# Patient Record
Sex: Male | Born: 1955 | Race: White | Hispanic: No | Smoking: Former smoker
Health system: Southern US, Community
[De-identification: ages and names within clinical notes are randomized; demographics above are authoritative.]

## PROBLEM LIST (undated history)

## (undated) DIAGNOSIS — W11XXXA Fall on and from ladder, initial encounter: Secondary | ICD-10-CM

## (undated) HISTORY — PX: NO PAST SURGERIES: SHX2092

---

## 2010-02-20 ENCOUNTER — Emergency Department (HOSPITAL_COMMUNITY): Admission: EM | Admit: 2010-02-20 | Discharge: 2010-02-20 | Payer: Self-pay | Admitting: Emergency Medicine

## 2010-03-24 ENCOUNTER — Encounter
Admission: RE | Admit: 2010-03-24 | Discharge: 2010-05-24 | Payer: Self-pay | Source: Home / Self Care | Attending: Orthopedic Surgery | Admitting: Orthopedic Surgery

## 2014-10-26 DIAGNOSIS — W11XXXA Fall on and from ladder, initial encounter: Secondary | ICD-10-CM

## 2014-10-26 HISTORY — DX: Fall on and from ladder, initial encounter: W11.XXXA

## 2014-10-27 ENCOUNTER — Inpatient Hospital Stay (HOSPITAL_COMMUNITY)
Admission: EM | Admit: 2014-10-27 | Discharge: 2014-11-05 | DRG: 493 | Disposition: A | Discharge status: Home / Self Care | Payer: Self-pay | Attending: Surgery | Admitting: Surgery

## 2014-10-27 ENCOUNTER — Encounter (HOSPITAL_COMMUNITY): Payer: Self-pay | Admitting: Emergency Medicine

## 2014-10-27 ENCOUNTER — Emergency Department (HOSPITAL_COMMUNITY): Payer: Self-pay

## 2014-10-27 ENCOUNTER — Inpatient Hospital Stay (HOSPITAL_COMMUNITY): Payer: Self-pay

## 2014-10-27 ENCOUNTER — Inpatient Hospital Stay (HOSPITAL_COMMUNITY): Payer: MEDICAID

## 2014-10-27 DIAGNOSIS — S63075A Dislocation of distal end of left ulna, initial encounter: Secondary | ICD-10-CM | POA: Diagnosis present

## 2014-10-27 DIAGNOSIS — Z87891 Personal history of nicotine dependence: Secondary | ICD-10-CM

## 2014-10-27 DIAGNOSIS — S52612A Displaced fracture of left ulna styloid process, initial encounter for closed fracture: Secondary | ICD-10-CM | POA: Diagnosis present

## 2014-10-27 DIAGNOSIS — W19XXXA Unspecified fall, initial encounter: Secondary | ICD-10-CM

## 2014-10-27 DIAGNOSIS — Z419 Encounter for procedure for purposes other than remedying health state, unspecified: Secondary | ICD-10-CM

## 2014-10-27 DIAGNOSIS — IMO0001 Reserved for inherently not codable concepts without codable children: Secondary | ICD-10-CM | POA: Diagnosis present

## 2014-10-27 DIAGNOSIS — W11XXXA Fall on and from ladder, initial encounter: Secondary | ICD-10-CM

## 2014-10-27 DIAGNOSIS — E559 Vitamin D deficiency, unspecified: Secondary | ICD-10-CM | POA: Diagnosis present

## 2014-10-27 DIAGNOSIS — R338 Other retention of urine: Secondary | ICD-10-CM | POA: Diagnosis not present

## 2014-10-27 DIAGNOSIS — S52592A Other fractures of lower end of left radius, initial encounter for closed fracture: Secondary | ICD-10-CM | POA: Diagnosis present

## 2014-10-27 DIAGNOSIS — S82143A Displaced bicondylar fracture of unspecified tibia, initial encounter for closed fracture: Secondary | ICD-10-CM

## 2014-10-27 DIAGNOSIS — R339 Retention of urine, unspecified: Secondary | ICD-10-CM | POA: Diagnosis not present

## 2014-10-27 DIAGNOSIS — S32010A Wedge compression fracture of first lumbar vertebra, initial encounter for closed fracture: Secondary | ICD-10-CM | POA: Diagnosis present

## 2014-10-27 DIAGNOSIS — D62 Acute posthemorrhagic anemia: Secondary | ICD-10-CM | POA: Diagnosis not present

## 2014-10-27 DIAGNOSIS — S82831A Other fracture of upper and lower end of right fibula, initial encounter for closed fracture: Secondary | ICD-10-CM | POA: Diagnosis present

## 2014-10-27 DIAGNOSIS — S5292XA Unspecified fracture of left forearm, initial encounter for closed fracture: Secondary | ICD-10-CM | POA: Diagnosis present

## 2014-10-27 DIAGNOSIS — S82141A Displaced bicondylar fracture of right tibia, initial encounter for closed fracture: Principal | ICD-10-CM | POA: Diagnosis present

## 2014-10-27 DIAGNOSIS — T79A0XA Compartment syndrome, unspecified, initial encounter: Secondary | ICD-10-CM | POA: Diagnosis present

## 2014-10-27 HISTORY — DX: Fall on and from ladder, initial encounter: W11.XXXA

## 2014-10-27 LAB — BASIC METABOLIC PANEL
ANION GAP: 9 (ref 5–15)
BUN: 11 mg/dL (ref 6–20)
CHLORIDE: 106 mmol/L (ref 101–111)
CO2: 25 mmol/L (ref 22–32)
Calcium: 9.4 mg/dL (ref 8.9–10.3)
Creatinine, Ser: 1.34 mg/dL — ABNORMAL HIGH (ref 0.61–1.24)
GFR calc Af Amer: >60 mL/min (ref >60)
GFR calc non Af Amer: 57 mL/min — ABNORMAL LOW (ref >60)
Glucose, Bld: 109 mg/dL — ABNORMAL HIGH (ref 65–99)
POTASSIUM: 4.1 mmol/L (ref 3.5–5.1)
SODIUM: 140 mmol/L (ref 135–145)

## 2014-10-27 LAB — CBC WITH DIFFERENTIAL/PLATELET
BASOS ABS: 0 10*3/uL (ref 0.0–0.1)
BASOS PCT: 1 % (ref 0–1)
Eosinophils Absolute: 0.2 10*3/uL (ref 0.0–0.7)
Eosinophils Relative: 2 % (ref 0–5)
HCT: 43.2 % (ref 39.0–52.0)
HEMOGLOBIN: 14.7 g/dL (ref 13.0–17.0)
LYMPHS PCT: 32 % (ref 12–46)
Lymphs Abs: 2.3 10*3/uL (ref 0.7–4.0)
MCH: 30.6 pg (ref 26.0–34.0)
MCHC: 34 g/dL (ref 30.0–36.0)
MCV: 89.8 fL (ref 78.0–100.0)
MONO ABS: 0.7 10*3/uL (ref 0.1–1.0)
MONOS PCT: 10 % (ref 3–12)
NEUTROS ABS: 3.8 10*3/uL (ref 1.7–7.7)
Neutrophils Relative %: 55 % (ref 43–77)
PLATELETS: 261 10*3/uL (ref 150–400)
RBC: 4.81 MIL/uL (ref 4.22–5.81)
RDW: 12.7 % (ref 11.5–15.5)
WBC: 7 10*3/uL (ref 4.0–10.5)

## 2014-10-27 MED ORDER — HYDROMORPHONE HCL 1 MG/ML IJ SOLN
1.0000 mg | INTRAMUSCULAR | Status: DC | PRN
  Administered 2014-10-27 (×2): 1 mg via INTRAVENOUS
  Filled 2014-10-27 (×2): qty 1

## 2014-10-27 MED ORDER — OXYCODONE HCL 5 MG PO TABS
10.0000 mg | ORAL_TABLET | ORAL | Status: DC | PRN
  Administered 2014-10-27 – 2014-10-28 (×3): 10 mg via ORAL
  Filled 2014-10-27 (×2): qty 2

## 2014-10-27 MED ORDER — ONDANSETRON HCL 4 MG PO TABS
4.0000 mg | ORAL_TABLET | Freq: Four times a day (QID) | ORAL | Status: DC | PRN
  Administered 2014-10-30: 4 mg via ORAL
  Filled 2014-10-27: qty 1

## 2014-10-27 MED ORDER — OXYCODONE HCL 5 MG PO TABS: 5.0000 mg | ORAL_TABLET | ORAL | Status: DC | PRN

## 2014-10-27 MED ORDER — DEXTROSE-NACL 5-0.9 % IV SOLN
INTRAVENOUS | Status: DC
  Administered 2014-10-27: 17:00:00 via INTRAVENOUS

## 2014-10-27 MED ORDER — ONDANSETRON HCL 4 MG/2ML IJ SOLN: 4.0000 mg | Freq: Four times a day (QID) | INTRAMUSCULAR | Status: DC | PRN

## 2014-10-27 MED ORDER — ENOXAPARIN SODIUM 40 MG/0.4ML ~~LOC~~ SOLN
40.0000 mg | SUBCUTANEOUS | Status: DC
  Administered 2014-10-29 – 2014-11-05 (×7): 40 mg via SUBCUTANEOUS
  Filled 2014-10-27 (×6): qty 0.4

## 2014-10-27 MED ORDER — FENTANYL CITRATE (PF) 100 MCG/2ML IJ SOLN
75.0000 ug | Freq: Once | INTRAMUSCULAR | Status: AC
  Administered 2014-10-27: 75 ug via INTRAVENOUS
  Filled 2014-10-27: qty 2

## 2014-10-27 MED ORDER — ACETAMINOPHEN 325 MG PO TABS: 650.0000 mg | ORAL_TABLET | ORAL | Status: DC | PRN

## 2014-10-27 MED ORDER — MORPHINE SULFATE 4 MG/ML IJ SOLN
4.0000 mg | Freq: Once | INTRAMUSCULAR | Status: AC
  Administered 2014-10-27: 4 mg via INTRAVENOUS
  Filled 2014-10-27: qty 1

## 2014-10-27 NOTE — Progress Notes (Signed)
Orthopedic Tech Progress Note Patient Details:  Breck CoonsMichael E Morais 06-20-1955 [id number] Applied fiberglass volar splint to LUE as ordered.  Pulses, sensation, motion intact before and after splinting.  Capillary refill less than 2 seconds before and after splinting. Ortho Devices Type of Ortho Device: Short arm splint Ortho Device/Splint Location: LUE Ortho Device/Splint Interventions: Application   Lesle ChrisGilliland, Haris Baack L 10/27/2014, 5:27 PM

## 2014-10-27 NOTE — ED Notes (Signed)
Family at beside. Family given emotional support. 

## 2014-10-27 NOTE — H&P (Signed)
[name] [name] is an 59 y.o. male.   Chief Complaint: Fall HPI: [name] [name] was on an extension ladder 15-20 feet high cutting a limb when it came down and struck the ladder. He jumped from the ladder and landed first on his feet and then fell backwards bracing himself with his arms. He denied loss of consciousness. He was brought to the ED as a level 2 trauma. He c/o left wrist, right knee, and LBP.  History reviewed. No pertinent past medical history.  History reviewed. No pertinent past surgical history.  No family history on file. Social History:  reports that he has quit smoking. He does not have any smokeless tobacco history on file. He reports that he does not drink alcohol or use illicit drugs.  Allergies: No Known Allergies  Results for orders placed or performed during the hospital encounter of 10/27/14 (from the past 48 hour(s))  CBC with Differential/Platelet     Status: None   Collection Time: 10/27/14  1:21 PM  Result Value Ref Range   WBC 7.0 4.0 - 10.5 K/uL   RBC 4.81 4.22 - 5.81 MIL/uL   Hemoglobin 14.7 13.0 - 17.0 g/dL   HCT 43.2 39.0 - 52.0 %   MCV 89.8 78.0 - 100.0 fL   MCH 30.6 26.0 - 34.0 pg   MCHC 34.0 30.0 - 36.0 g/dL   RDW 12.7 11.5 - 15.5 %   Platelets 261 150 - 400 K/uL   Neutrophils Relative % 55 43 - 77 %   Neutro Abs 3.8 1.7 - 7.7 K/uL   Lymphocytes Relative 32 12 - 46 %   Lymphs Abs 2.3 0.7 - 4.0 K/uL   Monocytes Relative 10 3 - 12 %   Monocytes Absolute 0.7 0.1 - 1.0 K/uL   Eosinophils Relative 2 0 - 5 %   Eosinophils Absolute 0.2 0.0 - 0.7 K/uL   Basophils Relative 1 0 - 1 %   Basophils Absolute 0.0 0.0 - 0.1 K/uL  Basic metabolic panel     Status: Abnormal   Collection Time: 10/27/14  1:21 PM  Result Value Ref Range   Sodium 140 135 - 145 mmol/L   Potassium 4.1 3.5 - 5.1 mmol/L   Chloride 106 101 - 111 mmol/L   CO2 25 22 - 32 mmol/L   Glucose, Bld 109 (H) 65 - 99 mg/dL   BUN 11 6 - 20 mg/dL   Creatinine, Ser 1.34 (H) 0.61 - 1.24 mg/dL    Calcium 9.4 8.9 - 10.3 mg/dL   GFR calc non Af Amer 57 (L) >60 mL/min   GFR calc Af Amer >60 >60 mL/min    Comment: (NOTE) The eGFR has been calculated using the CKD EPI equation. This calculation has not been validated in all clinical situations. eGFR's persistently <60 mL/min signify possible Chronic Kidney Disease.    Anion gap 9 5 - 15   Dg Chest 1 View  10/27/2014   CLINICAL DATA:  Patient fell 16 feet from ladder  EXAM: CHEST  1 VIEW  COMPARISON:  None.  FINDINGS: Lungs are clear. Heart size and pulmonary vascularity are normal. No adenopathy. No pneumothorax. There is degenerative change in both shoulders. No acute fracture evident.  IMPRESSION: No edema or consolidation. No acute fracture evident. No pneumothorax.   Electronically Signed   By: Lowella Grip III M.D.   On: 10/27/2014 14:35   Dg Lumbar Spine 2-3 Views  10/27/2014   CLINICAL DATA:  [place] from 16 foot ladder.  Low back pain.  EXAM: LUMBAR SPINE - 2-3 VIEW  COMPARISON:  02/20/2010  FINDINGS: There is a compression fracture through the L1 vertebral body with anterior wedging. Normal alignment. No fracture. No retropulsed fracture fragments. Disc spaces are maintained. SI joints are symmetric and unremarkable.  IMPRESSION: Mild to moderate compression fracture and anterior wedging at L1.   Electronically Signed   By: Rolm Baptise M.D.   On: 10/27/2014 14:54   Dg Pelvis 1-2 Views  10/27/2014   CLINICAL DATA:  Patient fell 16 feet from ladder  EXAM: PELVIS - 1-2 VIEW  COMPARISON:  None.  FINDINGS: There is no demonstrable fracture or dislocation. There is slight symmetric narrowing of both hip joints. No erosive change.  IMPRESSION: Slight symmetric narrowing both hip joints. No fracture or dislocation.   Electronically Signed   By: Lowella Grip III M.D.   On: 10/27/2014 14:37   Dg Wrist Complete Left  10/27/2014   CLINICAL DATA:  [place] off ladder trimming trees  EXAM: LEFT WRIST - COMPLETE 3+ VIEW  COMPARISON:  None.   FINDINGS: Four views of the left wrist submitted. There is impacted mild displaced fracture in distal left radius. Mild displaced fracture of ulnar styloid.  IMPRESSION: Impacted mild displaced fracture in distal left radial metaphysis. Mild displaced fracture of ulnar styloid.   Electronically Signed   By: Lahoma Crocker M.D.   On: 10/27/2014 14:32   Dg Tibia/fibula Right  10/27/2014   CLINICAL DATA:  Fall from ladder with right proximal tibial pain.  EXAM: RIGHT TIBIA AND FIBULA - 2 VIEW  COMPARISON:  None.  FINDINGS: Proximal tibial and fibular fractures are partially imaged. Remainders of the tibia and fibula are intact.  IMPRESSION: 1. Proximal tibial and fibular fractures better evaluated on dedicated views of the right knee performed the same day. 2. No additional evidence of an acute fracture.   Electronically Signed   By: Lorin Picket M.D.   On: 10/27/2014 14:35   Dg Knee Complete 4 Views Right  10/27/2014   CLINICAL DATA:  Status post fall from a 16 foot lateral while trimming training is unable to extend the leg at the knee  EXAM: RIGHT KNEE - COMPLETE 4+ VIEW  COMPARISON:  None.  FINDINGS: The patient has sustained an acute impacted fracture of the proximal tibial metaphysis. A fracture line extends obliquely both medially and laterally from the intercondylar notch region with avulsion of the intercondylar notches. There is a fracture of the fibular head. The femoral condyles and metaphysis are intact.  IMPRESSION: The patient has sustained acute tibial plateau fractures extending inferiorly both medially and laterally. There is also an acute fracture of the fibular head. The distal femur is intact.   Electronically Signed   By: David  Martinique M.D.   On: 10/27/2014 14:31   Dg Foot Complete Right  10/27/2014   CLINICAL DATA:  Patient fell off 16 foot ladder  EXAM: RIGHT FOOT COMPLETE - 3+ VIEW  COMPARISON:  None.  FINDINGS: Frontal, oblique, and lateral views obtained. There is no demonstrable  fracture or dislocation. Joint spaces appear intact. No erosive change.  IMPRESSION: No fracture or dislocation.  No appreciable arthropathy.   Electronically Signed   By: Lowella Grip III M.D.   On: 10/27/2014 14:34    Review of Systems  Constitutional: Negative for weight loss.  HENT: Negative for ear discharge, ear pain, hearing loss and tinnitus.   Eyes: Negative for blurred vision, double vision, photophobia and pain.  Respiratory: Negative for cough, sputum production and shortness of breath.   Cardiovascular: Negative for chest pain.  Gastrointestinal: Negative for nausea, vomiting and abdominal pain.  Genitourinary: Negative for dysuria, urgency, frequency and flank pain.  Musculoskeletal: Positive for back pain (Lower) and joint pain (Left wrist and right knee). Negative for myalgias, falls and neck pain.  Neurological: Negative for dizziness, tingling, sensory change, focal weakness, loss of consciousness and headaches.  Endo/Heme/Allergies: Does not bruise/bleed easily.  Psychiatric/Behavioral: Negative for depression, memory loss and substance abuse. The patient is not nervous/anxious.     Blood pressure 90/62, pulse 52, temperature 97.6 F (36.4 C), temperature source Oral, resp. rate 15, SpO2 98 %. Physical Exam  Vitals reviewed. Constitutional: He is oriented to person, place, and time. He appears well-developed and well-nourished. He is cooperative. No distress. Nasal cannula in place.  HENT:  Head: Normocephalic and atraumatic. Head is without raccoon's eyes, without Battle's sign, without abrasion, without contusion and without laceration.  Right Ear: Hearing and external ear normal. No lacerations. No drainage or tenderness. A foreign body (Cerumen) is present.  Left Ear: Hearing and external ear normal. No lacerations. No drainage or tenderness. A foreign body (Cerumen) is present.  Nose: Nose normal. No nose lacerations, sinus tenderness, nasal deformity or nasal  septal hematoma. No epistaxis.  Mouth/Throat: Uvula is midline, oropharynx is clear and moist and mucous membranes are normal. No lacerations. No oropharyngeal exudate.  Eyes: Conjunctivae, EOM and lids are normal. Pupils are equal, round, and reactive to light. Right eye exhibits no discharge. Left eye exhibits no discharge. No scleral icterus.  Neck: Trachea normal and normal range of motion. Neck supple. No JVD present. No spinous process tenderness and no muscular tenderness present. Carotid bruit is not present. No tracheal deviation present. No thyromegaly present.  Cardiovascular: Normal rate, regular rhythm, normal heart sounds, intact distal pulses and normal pulses.  Exam reveals no gallop and no friction rub.   No murmur heard. Respiratory: Effort normal and breath sounds normal. No stridor. No respiratory distress. He has no wheezes. He has no rales. He exhibits no tenderness, no bony tenderness, no laceration and no crepitus.  GI: Soft. Normal appearance and bowel sounds are normal. He exhibits no distension. There is no tenderness. There is no rigidity, no rebound, no guarding and no CVA tenderness.  Genitourinary: Penis normal.  Musculoskeletal: Normal range of motion. He exhibits no edema.       Left wrist: He exhibits tenderness and bony tenderness.       Right knee: Tenderness found.       Lumbar back: He exhibits tenderness and bony tenderness.  Lymphadenopathy:    He has no cervical adenopathy.  Neurological: He is alert and oriented to person, place, and time. He has normal strength. No cranial nerve deficit or sensory deficit. GCS eye subscore is 4. GCS verbal subscore is 5. GCS motor subscore is 6.  Skin: Skin is warm, dry and intact. He is not diaphoretic.  Psychiatric: He has a normal mood and affect. His speech is normal and behavior is normal.     Assessment/Plan Fall Left wrist fx -- Ortho to see, may need hand surgery Right tibia plateau/fibula fx -- Ortho to  see L1 fx -- NS to see  Other scans pending. Admit by trauma to floor.    Lisette Abu, PA-C Pager: [phone] General Trauma [facility] Pager: [phone] 10/27/2014, 3:40 PM

## 2014-10-27 NOTE — Consult Note (Signed)
Reason for Consult:left wrist injury, right knee injury and back injury Referring Physician: EDP  [name] [name] is an 59 y.o. male.  HPI: 59 yo male who fell 20 feet from a ladder when it fell after being struck by a falling tree limb.  The patient was cutting a tree limb with a chain saw when the limb contacted the ladder and knocked him off. He jumped and landed on his feet.  He complained of immediate pain in the right knee and leg and left wrist and low back.  No LOC.  EMS called and the patient was transported to the [facility] ED. Patient noted to have multiple fractures. Ortho consulted.  History reviewed. No pertinent past medical history.  History reviewed. No pertinent past surgical history.  History reviewed. No pertinent family history.  Social History:  reports that he has quit smoking. He does not have any smokeless tobacco history on file. He reports that he does not drink alcohol or use illicit drugs.  Allergies: No Known Allergies  Medications: I have reviewed the patient's current medications.  Results for orders placed or performed during the hospital encounter of 10/27/14 (from the past 48 hour(s))  CBC with Differential/Platelet     Status: None   Collection Time: 10/27/14  1:21 PM  Result Value Ref Range   WBC 7.0 4.0 - 10.5 K/uL   RBC 4.81 4.22 - 5.81 MIL/uL   Hemoglobin 14.7 13.0 - 17.0 g/dL   HCT 43.2 39.0 - 52.0 %   MCV 89.8 78.0 - 100.0 fL   MCH 30.6 26.0 - 34.0 pg   MCHC 34.0 30.0 - 36.0 g/dL   RDW 12.7 11.5 - 15.5 %   Platelets 261 150 - 400 K/uL   Neutrophils Relative % 55 43 - 77 %   Neutro Abs 3.8 1.7 - 7.7 K/uL   Lymphocytes Relative 32 12 - 46 %   Lymphs Abs 2.3 0.7 - 4.0 K/uL   Monocytes Relative 10 3 - 12 %   Monocytes Absolute 0.7 0.1 - 1.0 K/uL   Eosinophils Relative 2 0 - 5 %   Eosinophils Absolute 0.2 0.0 - 0.7 K/uL   Basophils Relative 1 0 - 1 %   Basophils Absolute 0.0 0.0 - 0.1 K/uL  Basic metabolic panel     Status: Abnormal    Collection Time: 10/27/14  1:21 PM  Result Value Ref Range   Sodium 140 135 - 145 mmol/L   Potassium 4.1 3.5 - 5.1 mmol/L   Chloride 106 101 - 111 mmol/L   CO2 25 22 - 32 mmol/L   Glucose, Bld 109 (H) 65 - 99 mg/dL   BUN 11 6 - 20 mg/dL   Creatinine, Ser 1.34 (H) 0.61 - 1.24 mg/dL   Calcium 9.4 8.9 - 10.3 mg/dL   GFR calc non Af Amer 57 (L) >60 mL/min   GFR calc Af Amer >60 >60 mL/min    Comment: (NOTE) The eGFR has been calculated using the CKD EPI equation. This calculation has not been validated in all clinical situations. eGFR's persistently <60 mL/min signify possible Chronic Kidney Disease.    Anion gap 9 5 - 15    Dg Chest 1 View  10/27/2014   CLINICAL DATA:  Patient fell 16 feet from ladder  EXAM: CHEST  1 VIEW  COMPARISON:  None.  FINDINGS: Lungs are clear. Heart size and pulmonary vascularity are normal. No adenopathy. No pneumothorax. There is degenerative change in both shoulders. No acute fracture  evident.  IMPRESSION: No edema or consolidation. No acute fracture evident. No pneumothorax.   Electronically Signed   By: Lowella Grip III M.D.   On: 10/27/2014 14:35   Dg Lumbar Spine 2-3 Views  10/27/2014   CLINICAL DATA:  [place] from 16 foot ladder.  Low back pain.  EXAM: LUMBAR SPINE - 2-3 VIEW  COMPARISON:  02/20/2010  FINDINGS: There is a compression fracture through the L1 vertebral body with anterior wedging. Normal alignment. No fracture. No retropulsed fracture fragments. Disc spaces are maintained. SI joints are symmetric and unremarkable.  IMPRESSION: Mild to moderate compression fracture and anterior wedging at L1.   Electronically Signed   By: Rolm Baptise M.D.   On: 10/27/2014 14:54   Dg Pelvis 1-2 Views  10/27/2014   CLINICAL DATA:  Patient fell 16 feet from ladder  EXAM: PELVIS - 1-2 VIEW  COMPARISON:  None.  FINDINGS: There is no demonstrable fracture or dislocation. There is slight symmetric narrowing of both hip joints. No erosive change.  IMPRESSION: Slight  symmetric narrowing both hip joints. No fracture or dislocation.   Electronically Signed   By: Lowella Grip III M.D.   On: 10/27/2014 14:37   Dg Wrist Complete Left  10/27/2014   CLINICAL DATA:  [place] off ladder trimming trees  EXAM: LEFT WRIST - COMPLETE 3+ VIEW  COMPARISON:  None.  FINDINGS: Four views of the left wrist submitted. There is impacted mild displaced fracture in distal left radius. Mild displaced fracture of ulnar styloid.  IMPRESSION: Impacted mild displaced fracture in distal left radial metaphysis. Mild displaced fracture of ulnar styloid.   Electronically Signed   By: Lahoma Crocker M.D.   On: 10/27/2014 14:32   Dg Tibia/fibula Right  10/27/2014   CLINICAL DATA:  Fall from ladder with right proximal tibial pain.  EXAM: RIGHT TIBIA AND FIBULA - 2 VIEW  COMPARISON:  None.  FINDINGS: Proximal tibial and fibular fractures are partially imaged. Remainders of the tibia and fibula are intact.  IMPRESSION: 1. Proximal tibial and fibular fractures better evaluated on dedicated views of the right knee performed the same day. 2. No additional evidence of an acute fracture.   Electronically Signed   By: Lorin Picket M.D.   On: 10/27/2014 14:35   Dg Knee Complete 4 Views Right  10/27/2014   CLINICAL DATA:  Status post fall from a 16 foot lateral while trimming training is unable to extend the leg at the knee  EXAM: RIGHT KNEE - COMPLETE 4+ VIEW  COMPARISON:  None.  FINDINGS: The patient has sustained an acute impacted fracture of the proximal tibial metaphysis. A fracture line extends obliquely both medially and laterally from the intercondylar notch region with avulsion of the intercondylar notches. There is a fracture of the fibular head. The femoral condyles and metaphysis are intact.  IMPRESSION: The patient has sustained acute tibial plateau fractures extending inferiorly both medially and laterally. There is also an acute fracture of the fibular head. The distal femur is intact.    Electronically Signed   By: David  Martinique M.D.   On: 10/27/2014 14:31   Dg Foot Complete Right  10/27/2014   CLINICAL DATA:  Patient fell off 16 foot ladder  EXAM: RIGHT FOOT COMPLETE - 3+ VIEW  COMPARISON:  None.  FINDINGS: Frontal, oblique, and lateral views obtained. There is no demonstrable fracture or dislocation. Joint spaces appear intact. No erosive change.  IMPRESSION: No fracture or dislocation.  No appreciable arthropathy.   Electronically  Signed   By: Lowella Grip III M.D.   On: 10/27/2014 14:34    ROS Blood pressure 93/66, pulse 54, temperature 97.6 F (36.4 C), temperature source Oral, resp. rate 14, SpO2 98 %. Physical Exam Awake and alert. Responds appropriately to commands. Left wrist skin intact, minimal swelling.  NVI, tender to palpation over the distal radius. Unable to range due to pain. He is able to wiggle fingers.  Right knee with minimal swelling and no deformity, no ROM due to pain.  Left leg with normal ROM. Spine tender to palpation.  Assessment/Plan: 1. Right closed, comminuted tibial plateau fracture.  CT pending.  Dr Marcelino Scot consulted. 2. Left closed distal radius fracture. Splinted. Will likely need surgery given the multiple extremities injured. 3. Lumbar compression vs burst fracture.  CT pending. Bedrest.  C spine and T spine imagine pending.  Patient will require multiple surgeries.  Surgical plan and timing to follow.  Will splint the left wrist for now.  DVT prophylaxis.  [name] Schooling, MD  [phone] cell  NORRIS,STEVEN R 10/27/2014, 4:05 PM

## 2014-10-27 NOTE — Consult Note (Signed)
Reason for Consult: L1 compression fracture Referring Physician: EDP  [name] [name] is an 59 y.o. male.   HPI:  59 year old male who fell 18-20 feet off a ladder and landed on his feet earlier today. He has a left wrist fracture and a right knee injury and was found to have an L1 compression fracture. He complains of some back pain but no leg pain or numbness tingling or weakness. No bowel or bladder changes. There was are stable. Symptoms are mild to moderate. They're aching in character. CT scan showed the L1 fracture and neurosurgical evaluation was requested by the trauma team.  History reviewed. No pertinent past medical history.  History reviewed. No pertinent past surgical history.  No Known Allergies  History  Substance Use Topics  . Smoking status: Former Games developermoker  . Smokeless tobacco: Not on file  . Alcohol Use: No    History reviewed. No pertinent family history.   Review of Systems  Positive ROS: Negative   All other systems have been reviewed and were otherwise negative with the exception of those mentioned in the HPI and as above.  Objective: Vital signs in last 24 hours: Temp:  [97.6 F (36.4 C)] 97.6 F (36.4 C) (05/25 1310) Pulse Rate:  [52-71] 65 (05/25 1716) Resp:  [14-26] 17 (05/25 1716) BP: (90-114)/(49-77) 98/49 mmHg (05/25 1716) SpO2:  [97 %-100 %] 98 % (05/25 1716)  General Appearance: Alert, cooperative, no distress, appears stated age Head: Normocephalic, without obvious abnormality, atraumatic Eyes: PERRL, conjunctiva/corneas clear, EOM's intact      Neck: Supple, symmetrical, trachea midline Lungs: respirations unlabored Heart: Regular rate and rhythm  NEUROLOGIC:   Mental status: A&O x4, no aphasia, good attention span, Memory and fund of knowledge Motor Exam - grossly normal, normal tone and bulk Sensory Exam - grossly normal Reflexes: symmetric, no pathologic reflexes, No Hoffman's, No clonus Coordination - grossly normal in upper  extremity  Gait - not tested Cranial Nerves: I: smell Not tested  II: visual acuity  OS: na    OD: na  II: visual fields Full to confrontation  II: pupils Equal, round, reactive to light  III,VII: ptosis None  III,IV,VI: extraocular muscles  Full ROM  V: mastication Normal  V: facial light touch sensation  Normal  V,VII: corneal reflex  Present  VII: facial muscle function - upper  Normal  VII: facial muscle function - lower Normal  VIII: hearing Not tested  IX: soft palate elevation  Normal  IX,X: gag reflex Present  XI: trapezius strength  5/5  XI: sternocleidomastoid strength 5/5  XI: neck flexion strength  5/5  XII: tongue strength  Normal    Data Review Lab Results  Component Value Date   WBC 7.0 10/27/2014   HGB 14.7 10/27/2014   HCT 43.2 10/27/2014   MCV 89.8 10/27/2014   PLT 261 10/27/2014   Lab Results  Component Value Date   NA 140 10/27/2014   K 4.1 10/27/2014   CL 106 10/27/2014   CO2 25 10/27/2014   BUN 11 10/27/2014   CREATININE 1.34* 10/27/2014   GLUCOSE 109* 10/27/2014   No results found for: INR, PROTIME  Radiology: Dg Chest 1 View  10/27/2014   CLINICAL DATA:  Patient fell 16 feet from ladder  EXAM: CHEST  1 VIEW  COMPARISON:  None.  FINDINGS: Lungs are clear. Heart size and pulmonary vascularity are normal. No adenopathy. No pneumothorax. There is degenerative change in both shoulders. No acute fracture evident.  IMPRESSION: No  edema or consolidation. No acute fracture evident. No pneumothorax.   Electronically Signed   By: Bretta Bang III M.D.   On: 10/27/2014 14:35   Dg Lumbar Spine 2-3 Views  10/27/2014   CLINICAL DATA:  Larey Seat from 16 foot ladder.  Low back pain.  EXAM: LUMBAR SPINE - 2-3 VIEW  COMPARISON:  02/20/2010  FINDINGS: There is a compression fracture through the L1 vertebral body with anterior wedging. Normal alignment. No fracture. No retropulsed fracture fragments. Disc spaces are maintained. SI joints are symmetric and  unremarkable.  IMPRESSION: Mild to moderate compression fracture and anterior wedging at L1.   Electronically Signed   By: Charlett Nose M.D.   On: 10/27/2014 14:54   Dg Pelvis 1-2 Views  10/27/2014   CLINICAL DATA:  Patient fell 16 feet from ladder  EXAM: PELVIS - 1-2 VIEW  COMPARISON:  None.  FINDINGS: There is no demonstrable fracture or dislocation. There is slight symmetric narrowing of both hip joints. No erosive change.  IMPRESSION: Slight symmetric narrowing both hip joints. No fracture or dislocation.   Electronically Signed   By: Bretta Bang III M.D.   On: 10/27/2014 14:37   Dg Wrist Complete Left  10/27/2014   CLINICAL DATA:  Larey Seat off ladder trimming trees  EXAM: LEFT WRIST - COMPLETE 3+ VIEW  COMPARISON:  None.  FINDINGS: Four views of the left wrist submitted. There is impacted mild displaced fracture in distal left radius. Mild displaced fracture of ulnar styloid.  IMPRESSION: Impacted mild displaced fracture in distal left radial metaphysis. Mild displaced fracture of ulnar styloid.   Electronically Signed   By: Natasha Mead M.D.   On: 10/27/2014 14:32   Dg Tibia/fibula Right  10/27/2014   CLINICAL DATA:  Fall from ladder with right proximal tibial pain.  EXAM: RIGHT TIBIA AND FIBULA - 2 VIEW  COMPARISON:  None.  FINDINGS: Proximal tibial and fibular fractures are partially imaged. Remainders of the tibia and fibula are intact.  IMPRESSION: 1. Proximal tibial and fibular fractures better evaluated on dedicated views of the right knee performed the same day. 2. No additional evidence of an acute fracture.   Electronically Signed   By: Leanna Battles M.D.   On: 10/27/2014 14:35   Dg Knee Complete 4 Views Right  10/27/2014   CLINICAL DATA:  Status post fall from a 16 foot lateral while trimming training is unable to extend the leg at the knee  EXAM: RIGHT KNEE - COMPLETE 4+ VIEW  COMPARISON:  None.  FINDINGS: The patient has sustained an acute impacted fracture of the proximal tibial  metaphysis. A fracture line extends obliquely both medially and laterally from the intercondylar notch region with avulsion of the intercondylar notches. There is a fracture of the fibular head. The femoral condyles and metaphysis are intact.  IMPRESSION: The patient has sustained acute tibial plateau fractures extending inferiorly both medially and laterally. There is also an acute fracture of the fibular head. The distal femur is intact.   Electronically Signed   By: Fallou Hulbert  [place] M.D.   On: 10/27/2014 14:31   Dg Foot Complete Right  10/27/2014   CLINICAL DATA:  Patient fell off 16 foot ladder  EXAM: RIGHT FOOT COMPLETE - 3+ VIEW  COMPARISON:  None.  FINDINGS: Frontal, oblique, and lateral views obtained. There is no demonstrable fracture or dislocation. Joint spaces appear intact. No erosive change.  IMPRESSION: No fracture or dislocation.  No appreciable arthropathy.   Electronically Signed   By:  Bretta Bang III M.D.   On: 10/27/2014 14:34     Assessment/Plan: CT scan the lumbar spine shows a moderate L1 anterior wedge compression fracture, with no retropulsion and no kyphosis, less than 50% loss of vertebral body height anteriorly. This should be a stable fracture. We should treat this in a TLSO brace. Be mobilized in the brace as tolerated and as allowed by orthopedics based on his knee injury. We will follow this with serial x-rays.   Celina Shiley S 10/27/2014 5:28 PM

## 2014-10-27 NOTE — ED Notes (Signed)
See trauma note-- pt "jumped'/fell 20 feet from ladder-- landing on feet-- pain in right heel/knee/low back/lefft wrist-- left wrist slightly deformed.

## 2014-10-27 NOTE — ED Provider Notes (Signed)
CSN: [id number]     Arrival date & time 10/27/14  1305 History   First MD Initiated Contact with Patient 10/27/14 1307     Chief Complaint  Patient presents with  . Fall  . level 2      (Consider location/radiation/quality/duration/timing/severity/associated sxs/prior Treatment) HPI Comments: Patient is a 59 year old male who presents by EMS after a fall. He was apparently working on an extension ladder when the latter began to fall and the patient was forced to jump to the ground. He landed on both feet, then fell forward and caught himself with his hands. He is complaining of pain in his right knee, right foot, left wrist, and low back. He denies any head injury. He denies any neck pain. He denies any chest pain or difficulty breathing.  Patient is a 59 y.o. male presenting with fall. The history is provided by the patient.  Fall This is a new problem. The current episode started less than 1 hour ago. The problem occurs constantly. The problem has not changed since onset.The symptoms are aggravated by walking (Movement and palpation). Nothing relieves the symptoms. He has tried nothing for the symptoms. The treatment provided no relief.    No past medical history on file. No past surgical history on file. No family history on file. History  Substance Use Topics  . Smoking status: Not on file  . Smokeless tobacco: Not on file  . Alcohol Use: Not on file    Review of Systems  All other systems reviewed and are negative.     Allergies  Review of patient's allergies indicates not on file.  Home Medications   Prior to Admission medications   Not on File   BP 95/65 mmHg  Pulse 59  Temp(Src) 97.6 F (36.4 C) (Oral)  Resp 18  SpO2 100% Physical Exam  Constitutional: He is oriented to person, place, and time. He appears well-developed and well-nourished. No distress.  HENT:  Head: Normocephalic and atraumatic.  Eyes: EOM are normal. Pupils are equal, round, and reactive  to light.  Neck: Normal range of motion. Neck supple.  There is no cervical spine tenderness or step-off. He has painless range of motion in all directions.  Cardiovascular: Normal rate, regular rhythm and normal heart sounds.   No murmur heard. Pulmonary/Chest: Effort normal and breath sounds normal. No respiratory distress. He has no wheezes.  Abdominal: Soft. Bowel sounds are normal. He exhibits no distension. There is no tenderness.  Musculoskeletal: Normal range of motion.  The left wrist is tender to palpation over the distal radius. There is no obvious deformity.  The right knee appears grossly normal. He has pain with range of motion, however no effusion is palpable. Range of motion motion is somewhat limited due to pain.  Neurological: He is alert and oriented to person, place, and time. No cranial nerve deficit. He exhibits normal muscle tone. Coordination normal.  Skin: Skin is warm and dry. He is not diaphoretic.  Nursing note and vitals reviewed.   ED Course  Procedures (including critical care time) Labs Review Labs Reviewed - No data to display  Imaging Review No results found.   EKG Interpretation None      MDM   Final diagnoses:  Fall  Fall  Fall    Patient brought by EMS after a fall from a ladder. He has a complicated tibial plateau fracture, a lumbar compression fracture, and a fracture of his left wrist. I've spoken with Dr. Ranell Patrick from orthopedics who  will evaluate the patient. I've also spoken with trauma surgery who will evaluate and admit the patient.    Geoffery Lyonsouglas Terina Mcelhinny, MD 10/29/14 1700

## 2014-10-28 ENCOUNTER — Inpatient Hospital Stay (HOSPITAL_COMMUNITY): Payer: MEDICAID

## 2014-10-28 ENCOUNTER — Inpatient Hospital Stay (HOSPITAL_COMMUNITY): Payer: MEDICAID | Admitting: Anesthesiology

## 2014-10-28 ENCOUNTER — Encounter (HOSPITAL_COMMUNITY): Admission: EM | Disposition: A | Discharge status: Home / Self Care | Payer: Self-pay | Source: Home / Self Care

## 2014-10-28 ENCOUNTER — Inpatient Hospital Stay (HOSPITAL_COMMUNITY): Payer: Self-pay

## 2014-10-28 ENCOUNTER — Inpatient Hospital Stay (HOSPITAL_COMMUNITY): Payer: Self-pay | Admitting: Anesthesiology

## 2014-10-28 HISTORY — PX: OPEN REDUCTION INTERNAL FIXATION (ORIF) DISTAL RADIAL FRACTURE: SHX5989

## 2014-10-28 HISTORY — PX: EXTERNAL FIXATION LEG: SHX1549

## 2014-10-28 HISTORY — PX: FASCIOTOMY: SHX132

## 2014-10-28 LAB — URINALYSIS, ROUTINE W REFLEX MICROSCOPIC
GLUCOSE, UA: 100 mg/dL — AB
Hgb urine dipstick: NEGATIVE
KETONES UR: 15 mg/dL — AB
LEUKOCYTES UA: NEGATIVE
NITRITE: NEGATIVE
PROTEIN: NEGATIVE mg/dL
Specific Gravity, Urine: 1.022 (ref 1.005–1.030)
UROBILINOGEN UA: 1 mg/dL (ref 0.0–1.0)
pH: 6 (ref 5.0–8.0)

## 2014-10-28 LAB — CBC
HCT: 39.9 % (ref 39.0–52.0)
Hemoglobin: 13.4 g/dL (ref 13.0–17.0)
MCH: 30.5 pg (ref 26.0–34.0)
MCHC: 33.6 g/dL (ref 30.0–36.0)
MCV: 90.9 fL (ref 78.0–100.0)
Platelets: 208 10*3/uL (ref 150–400)
RBC: 4.39 MIL/uL (ref 4.22–5.81)
RDW: 12.9 % (ref 11.5–15.5)
WBC: 10.7 10*3/uL — ABNORMAL HIGH (ref 4.0–10.5)

## 2014-10-28 LAB — BASIC METABOLIC PANEL
ANION GAP: 6 (ref 5–15)
BUN: 13 mg/dL (ref 6–20)
CALCIUM: 8.5 mg/dL — AB (ref 8.9–10.3)
CO2: 27 mmol/L (ref 22–32)
Chloride: 104 mmol/L (ref 101–111)
Creatinine, Ser: 1.03 mg/dL (ref 0.61–1.24)
GFR calc Af Amer: >60 mL/min (ref >60)
GFR calc non Af Amer: >60 mL/min (ref >60)
Glucose, Bld: 125 mg/dL — ABNORMAL HIGH (ref 65–99)
POTASSIUM: 3.6 mmol/L (ref 3.5–5.1)
Sodium: 137 mmol/L (ref 135–145)

## 2014-10-28 LAB — RAPID URINE DRUG SCREEN, HOSP PERFORMED
Amphetamines: NOT DETECTED
BENZODIAZEPINES: POSITIVE — AB
Barbiturates: NOT DETECTED
COCAINE: POSITIVE — AB
Opiates: POSITIVE — AB
Tetrahydrocannabinol: NOT DETECTED

## 2014-10-28 SURGERY — OPEN REDUCTION INTERNAL FIXATION (ORIF) DISTAL RADIUS FRACTURE
Anesthesia: General | Site: Wrist | Laterality: Right

## 2014-10-28 MED ORDER — OXYCODONE HCL 5 MG PO TABS: 5.0000 mg | ORAL_TABLET | ORAL | Status: DC | PRN

## 2014-10-28 MED ORDER — ROCURONIUM BROMIDE 50 MG/5ML IV SOLN
INTRAVENOUS | Status: AC
  Filled 2014-10-28: qty 1

## 2014-10-28 MED ORDER — ONDANSETRON HCL 4 MG/2ML IJ SOLN
INTRAMUSCULAR | Status: DC | PRN
  Administered 2014-10-28: 4 mg via INTRAVENOUS

## 2014-10-28 MED ORDER — ARTIFICIAL TEARS OP OINT
TOPICAL_OINTMENT | OPHTHALMIC | Status: DC | PRN
  Administered 2014-10-28: 1 via OPHTHALMIC

## 2014-10-28 MED ORDER — FENTANYL CITRATE (PF) 250 MCG/5ML IJ SOLN
INTRAMUSCULAR | Status: AC
  Filled 2014-10-28: qty 5

## 2014-10-28 MED ORDER — OXYCODONE HCL 5 MG PO TABS
5.0000 mg | ORAL_TABLET | ORAL | Status: DC | PRN
  Administered 2014-10-28 – 2014-10-29 (×3): 10 mg via ORAL
  Administered 2014-10-29 – 2014-11-03 (×4): 15 mg via ORAL
  Administered 2014-11-03 (×2): 10 mg via ORAL
  Administered 2014-11-03 – 2014-11-05 (×8): 15 mg via ORAL
  Filled 2014-10-28 (×2): qty 3
  Filled 2014-10-28: qty 2
  Filled 2014-10-28 (×4): qty 3
  Filled 2014-10-28: qty 2
  Filled 2014-10-28 (×3): qty 3
  Filled 2014-10-28: qty 2
  Filled 2014-10-28: qty 3
  Filled 2014-10-28: qty 2
  Filled 2014-10-28 (×2): qty 3
  Filled 2014-10-28: qty 2
  Filled 2014-10-28: qty 3

## 2014-10-28 MED ORDER — DEXAMETHASONE SODIUM PHOSPHATE 10 MG/ML IJ SOLN
INTRAMUSCULAR | Status: DC | PRN
  Administered 2014-10-28: 4 mg via INTRAVENOUS

## 2014-10-28 MED ORDER — CEFAZOLIN SODIUM-DEXTROSE 2-3 GM-% IV SOLR
2.0000 g | Freq: Three times a day (TID) | INTRAVENOUS | Status: AC
  Administered 2014-10-28 – 2014-10-29 (×3): 2 g via INTRAVENOUS
  Filled 2014-10-28 (×3): qty 50

## 2014-10-28 MED ORDER — ROCURONIUM BROMIDE 100 MG/10ML IV SOLN
INTRAVENOUS | Status: DC | PRN
  Administered 2014-10-28: 40 mg via INTRAVENOUS
  Administered 2014-10-28: 20 mg via INTRAVENOUS
  Administered 2014-10-28 (×2): 10 mg via INTRAVENOUS

## 2014-10-28 MED ORDER — PROPOFOL 10 MG/ML IV BOLUS
INTRAVENOUS | Status: DC | PRN
  Administered 2014-10-28: 150 mg via INTRAVENOUS

## 2014-10-28 MED ORDER — DEXAMETHASONE SODIUM PHOSPHATE 10 MG/ML IJ SOLN
INTRAMUSCULAR | Status: AC
  Filled 2014-10-28: qty 1

## 2014-10-28 MED ORDER — MIDAZOLAM HCL 5 MG/5ML IJ SOLN
INTRAMUSCULAR | Status: DC | PRN
  Administered 2014-10-28: 2 mg via INTRAVENOUS

## 2014-10-28 MED ORDER — MIDAZOLAM HCL 2 MG/2ML IJ SOLN
INTRAMUSCULAR | Status: AC
  Filled 2014-10-28: qty 2

## 2014-10-28 MED ORDER — GLYCOPYRROLATE 0.2 MG/ML IJ SOLN
INTRAMUSCULAR | Status: AC
  Filled 2014-10-28: qty 4

## 2014-10-28 MED ORDER — SODIUM CHLORIDE 0.9 % IR SOLN
Status: DC | PRN
  Administered 2014-10-28: 1000 mL

## 2014-10-28 MED ORDER — KETOROLAC TROMETHAMINE 30 MG/ML IJ SOLN
30.0000 mg | Freq: Once | INTRAMUSCULAR | Status: AC | PRN
  Administered 2014-10-28: 30 mg via INTRAVENOUS

## 2014-10-28 MED ORDER — PROPOFOL 10 MG/ML IV BOLUS
INTRAVENOUS | Status: AC
  Filled 2014-10-28: qty 20

## 2014-10-28 MED ORDER — DOCUSATE SODIUM 100 MG PO CAPS
100.0000 mg | ORAL_CAPSULE | Freq: Two times a day (BID) | ORAL | Status: DC
  Administered 2014-10-28 – 2014-11-05 (×8): 100 mg via ORAL
  Filled 2014-10-28 (×11): qty 1

## 2014-10-28 MED ORDER — ACETAMINOPHEN 10 MG/ML IV SOLN
1000.0000 mg | Freq: Four times a day (QID) | INTRAVENOUS | Status: DC
  Administered 2014-10-28 – 2014-10-29 (×3): 1000 mg via INTRAVENOUS
  Filled 2014-10-28 (×3): qty 100

## 2014-10-28 MED ORDER — LIDOCAINE HCL (CARDIAC) 20 MG/ML IV SOLN
INTRAVENOUS | Status: AC
  Filled 2014-10-28: qty 5

## 2014-10-28 MED ORDER — METHOCARBAMOL 1000 MG/10ML IJ SOLN
1000.0000 mg | Freq: Four times a day (QID) | INTRAVENOUS | Status: DC
  Administered 2014-10-28 – 2014-11-03 (×23): 1000 mg via INTRAVENOUS
  Filled 2014-10-28 (×27): qty 10

## 2014-10-28 MED ORDER — GLYCOPYRROLATE 0.2 MG/ML IJ SOLN
INTRAMUSCULAR | Status: DC | PRN
  Administered 2014-10-28: 0.6 mg via INTRAVENOUS

## 2014-10-28 MED ORDER — FENTANYL CITRATE (PF) 100 MCG/2ML IJ SOLN
INTRAMUSCULAR | Status: DC | PRN
  Administered 2014-10-28 (×2): 50 ug via INTRAVENOUS
  Administered 2014-10-28: 100 ug via INTRAVENOUS
  Administered 2014-10-28 (×2): 50 ug via INTRAVENOUS
  Administered 2014-10-28: 100 ug via INTRAVENOUS

## 2014-10-28 MED ORDER — LIDOCAINE HCL (CARDIAC) 20 MG/ML IV SOLN
INTRAVENOUS | Status: DC | PRN
  Administered 2014-10-28: 80 mg via INTRAVENOUS

## 2014-10-28 MED ORDER — NEOSTIGMINE METHYLSULFATE 10 MG/10ML IV SOLN
INTRAVENOUS | Status: DC | PRN
  Administered 2014-10-28: 3 mg via INTRAVENOUS

## 2014-10-28 MED ORDER — HYDROMORPHONE HCL 1 MG/ML IJ SOLN: 0.5000 mg | INTRAMUSCULAR | Status: DC | PRN

## 2014-10-28 MED ORDER — HYDROMORPHONE HCL 1 MG/ML IJ SOLN
INTRAMUSCULAR | Status: AC
  Filled 2014-10-28: qty 1

## 2014-10-28 MED ORDER — KETOROLAC TROMETHAMINE 30 MG/ML IJ SOLN
INTRAMUSCULAR | Status: AC
  Filled 2014-10-28: qty 1

## 2014-10-28 MED ORDER — HYDROMORPHONE HCL 1 MG/ML IJ SOLN
0.2500 mg | INTRAMUSCULAR | Status: DC | PRN
Start: 2014-10-28 — End: 2014-10-28
  Administered 2014-10-28 (×2): 0.5 mg via INTRAVENOUS

## 2014-10-28 MED ORDER — ONDANSETRON HCL 4 MG/2ML IJ SOLN
INTRAMUSCULAR | Status: AC
  Filled 2014-10-28: qty 2

## 2014-10-28 MED ORDER — PROMETHAZINE HCL 25 MG/ML IJ SOLN: 6.2500 mg | INTRAMUSCULAR | Status: DC | PRN

## 2014-10-28 MED ORDER — POLYETHYLENE GLYCOL 3350 17 G PO PACK
17.0000 g | PACK | Freq: Every day | ORAL | Status: DC
  Administered 2014-10-28 – 2014-11-04 (×4): 17 g via ORAL
  Filled 2014-10-28 (×6): qty 1

## 2014-10-28 MED ORDER — LACTATED RINGERS IV SOLN
INTRAVENOUS | Status: DC | PRN
  Administered 2014-10-28 (×2): via INTRAVENOUS

## 2014-10-28 MED ORDER — NEOSTIGMINE METHYLSULFATE 10 MG/10ML IV SOLN
INTRAVENOUS | Status: AC
  Filled 2014-10-28: qty 1

## 2014-10-28 MED ORDER — CEFAZOLIN SODIUM-DEXTROSE 2-3 GM-% IV SOLR
INTRAVENOUS | Status: AC
  Filled 2014-10-28: qty 50

## 2014-10-28 MED ORDER — CEFAZOLIN SODIUM-DEXTROSE 2-3 GM-% IV SOLR
INTRAVENOUS | Status: DC | PRN
  Administered 2014-10-28: 2 g via INTRAVENOUS

## 2014-10-28 SURGICAL SUPPLY — 102 items
BANDAGE ELASTIC 3 VELCRO ST LF (GAUZE/BANDAGES/DRESSINGS) ×2 IMPLANT
BANDAGE ELASTIC 4 VELCRO ST LF (GAUZE/BANDAGES/DRESSINGS) ×2 IMPLANT
BANDAGE ELASTIC 6 VELCRO ST LF (GAUZE/BANDAGES/DRESSINGS) ×2 IMPLANT
BANDAGE ESMARK 6X9 LF (GAUZE/BANDAGES/DRESSINGS) ×2 IMPLANT
BAR EXFX 500X11 NS LF (MISCELLANEOUS) ×4
BAR GLASS FIBER EXFX 11X500 (MISCELLANEOUS) ×4 IMPLANT
BIT DRILL 2.2 SS TIBIAL (BIT) ×2 IMPLANT
BIT DRILL 2.9 (BIT) ×2
BIT DRILL DVR 2.9 (BIT) IMPLANT
BLADE AVERAGE 25MMX9MM (BLADE)
BLADE AVERAGE 25X9 (BLADE) ×2 IMPLANT
BLADE SURG ROTATE 9660 (MISCELLANEOUS) ×6 IMPLANT
BNDG CMPR 9X4 STRL LF SNTH (GAUZE/BANDAGES/DRESSINGS) ×2
BNDG CMPR 9X6 STRL LF SNTH (GAUZE/BANDAGES/DRESSINGS)
BNDG COHESIVE 6X5 TAN STRL LF (GAUZE/BANDAGES/DRESSINGS) ×4 IMPLANT
BNDG ESMARK 4X9 LF (GAUZE/BANDAGES/DRESSINGS) ×4 IMPLANT
BNDG ESMARK 6X9 LF (GAUZE/BANDAGES/DRESSINGS)
BNDG GAUZE ELAST 4 BULKY (GAUZE/BANDAGES/DRESSINGS) ×10 IMPLANT
BRUSH SCRUB DISP (MISCELLANEOUS) ×8 IMPLANT
CLEANER TIP ELECTROSURG 2X2 (MISCELLANEOUS) ×2 IMPLANT
CLOSURE WOUND 1/2 X4 (GAUZE/BANDAGES/DRESSINGS)
COVER SURGICAL LIGHT HANDLE (MISCELLANEOUS) ×6 IMPLANT
CUFF TOURNIQUET SINGLE 18IN (TOURNIQUET CUFF) IMPLANT
CUFF TOURNIQUET SINGLE 24IN (TOURNIQUET CUFF) IMPLANT
CUFF TOURNIQUET SINGLE 34IN LL (TOURNIQUET CUFF) IMPLANT
DECANTER SPIKE VIAL GLASS SM (MISCELLANEOUS) IMPLANT
DRAPE C-ARM 42X72 X-RAY (DRAPES) IMPLANT
DRAPE C-ARMOR (DRAPES) ×4 IMPLANT
DRAPE OEC MINIVIEW 54X84 (DRAPES) ×4 IMPLANT
DRAPE U-SHAPE 47X51 STRL (DRAPES) ×6 IMPLANT
DRILL BIT 2.9MM (BIT) ×4
DRSG ADAPTIC 3X8 NADH LF (GAUZE/BANDAGES/DRESSINGS) ×2 IMPLANT
DRSG EMULSION OIL 3X3 NADH (GAUZE/BANDAGES/DRESSINGS) ×2 IMPLANT
DRSG MEPITEL 4X7.2 (GAUZE/BANDAGES/DRESSINGS) ×2 IMPLANT
ELECT REM PT RETURN 9FT ADLT (ELECTROSURGICAL) ×4
ELECTRODE REM PT RTRN 9FT ADLT (ELECTROSURGICAL) ×2 IMPLANT
EVACUATOR 1/8 PVC DRAIN (DRAIN) IMPLANT
GAUZE SPONGE 4X4 12PLY STRL (GAUZE/BANDAGES/DRESSINGS) ×2 IMPLANT
GLOVE BIO SURGEON STRL SZ7.5 (GLOVE) ×6 IMPLANT
GLOVE BIO SURGEON STRL SZ8 (GLOVE) ×6 IMPLANT
GLOVE BIOGEL PI IND STRL 7.5 (GLOVE) ×2 IMPLANT
GLOVE BIOGEL PI IND STRL 8 (GLOVE) ×2 IMPLANT
GLOVE BIOGEL PI INDICATOR 7.5 (GLOVE) ×2
GLOVE BIOGEL PI INDICATOR 8 (GLOVE) ×4
GOWN STRL REUS W/ TWL LRG LVL3 (GOWN DISPOSABLE) ×4 IMPLANT
GOWN STRL REUS W/ TWL XL LVL3 (GOWN DISPOSABLE) ×2 IMPLANT
GOWN STRL REUS W/TWL LRG LVL3 (GOWN DISPOSABLE) ×8
GOWN STRL REUS W/TWL XL LVL3 (GOWN DISPOSABLE) ×4
HANDPIECE INTERPULSE COAX TIP (DISPOSABLE)
K-WIRE 1.6 (WIRE) ×12
K-WIRE FX5X1.6XNS BN SS (WIRE) ×6
KIT BASIN OR (CUSTOM PROCEDURE TRAY) ×4 IMPLANT
KIT ROOM TURNOVER OR (KITS) ×4 IMPLANT
KWIRE FX5X1.6XNS BN SS (WIRE) IMPLANT
MANIFOLD NEPTUNE II (INSTRUMENTS) ×2 IMPLANT
NDL HYPO 25GX1X1/2 BEV (NEEDLE) IMPLANT
NEEDLE 22X1 1/2 (OR ONLY) (NEEDLE) IMPLANT
NEEDLE HYPO 25GX1X1/2 BEV (NEEDLE) IMPLANT
NS IRRIG 1000ML POUR BTL (IV SOLUTION) ×4 IMPLANT
PACK ORTHO EXTREMITY (CUSTOM PROCEDURE TRAY) ×4 IMPLANT
PAD ARMBOARD 7.5X6 YLW CONV (MISCELLANEOUS) ×8 IMPLANT
PAD CAST 3X4 CTTN HI CHSV (CAST SUPPLIES) ×2 IMPLANT
PADDING CAST COTTON 3X4 STRL (CAST SUPPLIES) ×4
PADDING CAST COTTON 6X4 STRL (CAST SUPPLIES) ×6 IMPLANT
PADDING UNDERCAST 2 STRL (CAST SUPPLIES) ×2
PADDING UNDERCAST 2X4 STRL (CAST SUPPLIES) IMPLANT
PEG THREADED 2.5MMX20MM LONG (Peg) ×2 IMPLANT
PEG THREADED 2.5MMX22MM LONG (Peg) ×6 IMPLANT
PEG THREADED 2.5MMX24MM LONG (Peg) ×4 IMPLANT
PIN CLAMP 2BAR 75MM BLUE (PIN) ×4 IMPLANT
PIN HALF YELLOW 5X160X35 (PIN) ×8 IMPLANT
PLATE STAN 24.4X59.5 LT (Plate) ×2 IMPLANT
SCREW  LP NL 2.7X15MM (Screw) ×2 IMPLANT
SCREW LP NL 2.7X15MM (Screw) IMPLANT
SCREW PEG 2.5X12 NONLOCK (Screw) ×2 IMPLANT
SCREW PEG 2.5X14 NONLOCK (Screw) ×4 IMPLANT
SCREW PEG 2.5X16 NONLOCK (Screw) ×2 IMPLANT
SET HNDPC FAN SPRY TIP SCT (DISPOSABLE) IMPLANT
SET MONITOR QUICK PRESSURE (MISCELLANEOUS) ×2 IMPLANT
SPONGE LAP 18X18 X RAY DECT (DISPOSABLE) ×2 IMPLANT
SPONGE SCRUB IODOPHOR (GAUZE/BANDAGES/DRESSINGS) ×12 IMPLANT
STAPLER VISISTAT 35W (STAPLE) ×2 IMPLANT
STOCKINETTE IMPERVIOUS LG (DRAPES) ×4 IMPLANT
STRIP CLOSURE SKIN 1/2X4 (GAUZE/BANDAGES/DRESSINGS) IMPLANT
SUCTION FRAZIER TIP 10 FR DISP (SUCTIONS) ×4 IMPLANT
SUT ETHILON 3 0 PS 1 (SUTURE) ×10 IMPLANT
SUT VIC AB 0 CT1 27 (SUTURE)
SUT VIC AB 0 CT1 27XBRD ANBCTR (SUTURE) ×4 IMPLANT
SUT VIC AB 0 CT2 27 (SUTURE) ×4 IMPLANT
SUT VIC AB 2-0 CT1 27 (SUTURE) ×8
SUT VIC AB 2-0 CT1 TAPERPNT 27 (SUTURE) ×4 IMPLANT
SUT VIC AB 2-0 CT3 27 (SUTURE) IMPLANT
SUT VIC AB 2-0 SH 27 (SUTURE)
SUT VIC AB 2-0 SH 27XBRD (SUTURE) ×4 IMPLANT
SYR CONTROL 10ML LL (SYRINGE) IMPLANT
TOWEL OR 17X24 6PK STRL BLUE (TOWEL DISPOSABLE) ×8 IMPLANT
TOWEL OR 17X26 10 PK STRL BLUE (TOWEL DISPOSABLE) ×8 IMPLANT
TUBE CONNECTING 12'X1/4 (SUCTIONS) ×1
TUBE CONNECTING 12X1/4 (SUCTIONS) ×3 IMPLANT
UNDERPAD 30X30 INCONTINENT (UNDERPADS AND DIAPERS) ×8 IMPLANT
WATER STERILE IRR 1000ML POUR (IV SOLUTION) ×8 IMPLANT
YANKAUER SUCT BULB TIP NO VENT (SUCTIONS) ×4 IMPLANT

## 2014-10-28 NOTE — Progress Notes (Signed)
Patient ID: [name] [name], male   DOB: [birth year], 59 y.o.   MRN: [id number]   LOS: 1 day   Subjective: Sleepy after OR but doing ok.   Objective: Vital signs in last 24 hours: Temp:  [98.3 F (36.8 C)-99 F (37.2 C)] 98.6 F (37 C) (05/26 1147) Pulse Rate:  [52-114] 62 (05/26 1147) Resp:  [10-26] 16 (05/26 1147) BP: (90-123)/(49-97) 115/82 mmHg (05/26 1147) SpO2:  [92 %-100 %] 98 % (05/26 1147) Weight:  [71.9 kg (158 lb 8.2 oz)] 71.9 kg (158 lb 8.2 oz) (05/25 2025) Last BM Date: 10/26/14   Laboratory  CBC  Recent Labs  10/27/14 1321 10/28/14 0430  WBC 7.0 10.7*  HGB 14.7 13.4  HCT 43.2 39.9  PLT 261 208   BMET  Recent Labs  10/27/14 1321 10/28/14 0430  NA 140 137  K 4.1 3.6  CL 106 104  CO2 25 27  GLUCOSE 109* 125*  BUN 11 13  CREATININE 1.34* 1.03  CALCIUM 9.4 8.5*    Physical Exam General appearance: alert and no distress Resp: clear to auscultation bilaterally Cardio: regular rate and rhythm GI: normal findings: bowel sounds normal and soft, non-tender Extremities: NVI   Assessment/Plan: Fall Left wrist fx s/p ORIF -- per Dr. Carola FrostHandy L1 compression fx -- TLSO per Dr. Yetta BarreJones Right tibia plateau/fibula fx s/p ex fix -- Plan definitive repair Tuesday per Dr. Carola FrostHandy FEN -- SL IV VTE -- SCD's, Lovenox Dispo -- PT/OT    Freeman CaldronMichael J. Otha Rickles, PA-C Pager: [phone] General Trauma PA Pager: [phone]  10/28/2014

## 2014-10-28 NOTE — Progress Notes (Signed)
Called ortho to order TLSO brace

## 2014-10-28 NOTE — Care Management Note (Signed)
Case Management Note  Patient Details  Name: [name] [name] MRN: [id number] Date of Birth: 10-09-1955  Subjective/Objective:     Pt s/p rt tibia plateau/ fibula fracture and Lt wrist fracture on 10/27/14.  To OR today for Lt wrist and closed reduction of rt tib plateau/fib fx with fasciotomies.  PTA, pt independent of ADLS.           Action/Plan: Will follow for discharge planning as pt progresses.    Expected Discharge Date:                  Expected Discharge Plan:  Home w Home Health Services  In-House Referral:     Discharge planning Services  CM Consult  Post Acute Care Choice:    Choice offered to:     DME Arranged:    DME Agency:     HH Arranged:    HH Agency:     Status of Service:  In process, will continue to follow  Medicare Important Message Given:    Date Medicare IM Given:    Medicare IM give by:    Date Additional Medicare IM Given:    Additional Medicare Important Message give by:     If discussed at Long Length of Stay Meetings, dates discussed:    Additional Comments:  Quintella BatonJulie W. Kimi Bordeau, RN, BSN  Trauma/Neuro ICU Case Manager [phone]

## 2014-10-28 NOTE — Transfer of Care (Signed)
Immediate Anesthesia Transfer of Care Note  Patient: [name] [name]  Procedure(s) Performed: Procedure(s): EXTERNAL FIXATION LEG (Right) OPEN REDUCTION INTERNAL FIXATION (ORIF) DISTAL RADIAL FRACTURE (Left) FASCIOTOMY (Right)  Patient Location: PACU  Anesthesia Type:General  Level of Consciousness: sedated  Airway & Oxygen Therapy: Patient Spontanous Breathing and Patient connected to nasal cannula oxygen  Post-op Assessment: Report given to RN and Post -op Vital signs reviewed and stable  Post vital signs: stable  Last Vitals:  Filed Vitals:   10/28/14 0712  BP: 100/70  Pulse: 73  Temp: 37.2 C  Resp: 20    Complications: No apparent anesthesia complications

## 2014-10-28 NOTE — Anesthesia Procedure Notes (Signed)
Procedure Name: Intubation Date/Time: 10/28/2014 8:15 AM Performed by: Renford DillsMULLINS, Torres Hardenbrook L Pre-anesthesia Checklist: Patient identified, Emergency Drugs available, Suction available and Patient being monitored Patient Re-evaluated:Patient Re-evaluated prior to inductionOxygen Delivery Method: Circle system utilized Preoxygenation: Pre-oxygenation with 100% oxygen Intubation Type: IV induction Ventilation: Mask ventilation without difficulty Laryngoscope Size: Miller and 2 Grade View: Grade I Tube type: Oral Number of attempts: 1 Airway Equipment and Method: Stylet Placement Confirmation: ETT inserted through vocal cords under direct vision,  positive ETCO2 and breath sounds checked- equal and bilateral Secured at: 21 cm Tube secured with: Tape Dental Injury: Teeth and Oropharynx as per pre-operative assessment

## 2014-10-28 NOTE — Brief Op Note (Signed)
10/27/2014 - 10/28/2014  10:42 AM  PATIENT:  [name] [name]  59 y.o. male  PRE-OPERATIVE DIAGNOSIS:   1. RIGHT BICONDYLAR PLATEAU FRACTURE 2. LEFT DISTAL RADIUS FRACTURE  POST-OPERATIVE DIAGNOSIS:   1. RIGHT BICONDYLAR PLATEAU FRACTURE 2. LEFT DISTAL RADIUS FRACTURE 3. RIGHT LEG ANTERIOR AND LATERAL COMPARTMENT SYNDROME  PROCEDURES:   1. CLOSED REDUCTION RIGHT TIBIAL PLATEAU (Right) 2. APPLICATION OF SPANNING EXTERNAL FIXATOR  3. OPEN REDUCTION INTERNAL FIXATION (ORIF) DISTAL RADIAL FRACTURE (Left) 4. FASCIOTOMY (Right) ANTERIOR AND LATERAL COMPARTMENTS 5. MEASUREMENT OF RIGHT LEG COMPARTMENTS  SURGEON:  Surgeon(s) and Role:    * [name] GalasMichael Achaia Garlock, MD - Primary  PHYSICIAN ASSISTANT: Montez MoritaKeith Paul, PA-C  ANESTHESIA:   general  I/O:  Total I/O In: 1000 [I.V.:1000] Out: 475 [Urine:400; Blood:75]  SPECIMEN:  No Specimen  TOURNIQUET:  NONE  DICTATION: .Other Dictation: Dictation Number [id number]

## 2014-10-28 NOTE — Anesthesia Preprocedure Evaluation (Signed)
Anesthesia Evaluation  Patient identified by MRN, date of birth, ID band Patient awake    Reviewed: Allergy & Precautions, NPO status , Patient's Chart, lab work & pertinent test results  Airway Mallampati: II  TM Distance: >3 FB Neck ROM: Full    Dental no notable dental hx.    Pulmonary neg pulmonary ROS, former smoker,  breath sounds clear to auscultation  Pulmonary exam normal       Cardiovascular negative cardio ROS Normal cardiovascular examRhythm:Regular Rate:Normal     Neuro/Psych negative neurological ROS  negative psych ROS   GI/Hepatic negative GI ROS, Neg liver ROS,   Endo/Other  negative endocrine ROS  Renal/GU negative Renal ROS  negative genitourinary   Musculoskeletal negative musculoskeletal ROS (+)   Abdominal   Peds negative pediatric ROS (+)  Hematology negative hematology ROS (+)   Anesthesia Other Findings   Reproductive/Obstetrics negative OB ROS                             Anesthesia Physical Anesthesia Plan  ASA: II  Anesthesia Plan: General   Post-op Pain Management:    Induction: Intravenous  Airway Management Planned: Oral ETT  Additional Equipment:   Intra-op Plan:   Post-operative Plan: Extubation in OR  Informed Consent: I have reviewed the patients History and Physical, chart, labs and discussed the procedure including the risks, benefits and alternatives for the proposed anesthesia with the patient or authorized representative who has indicated his/her understanding and acceptance.   Dental advisory given  Plan Discussed with: CRNA and Surgeon  Anesthesia Plan Comments:         Anesthesia Quick Evaluation  

## 2014-10-28 NOTE — Progress Notes (Addendum)
Orthopaedic Trauma Service Consultation  Reason for Consult: R bicondylar plateau, left wrist fractures Referring Physician: Netta Cedars, MD  [name] [name] is an 59 y.o. male.  HPI: Golden Circle approximately 20 feet.  Has declined to extend knee into a more favorable position to allow swelling resolution.  I have seen and examined the patient. Given the complexity of the fracture pattern, Dr. Veverly Fells asserted this was outside his scope of practice and that it would be in the best interest of the patient to have these injuries evaluated and treated by a fellowship trained orthopaedic traumatologist.     Past Medical History  Diagnosis Date  . Accidental fall from ladder 10/26/2014    "jumped to prevent chainsaw injury"    Past Surgical History  Procedure Laterality Date  . No past surgeries      History reviewed. No pertinent family history.  Social History:  reports that he has quit smoking. His smoking use included Cigarettes. He has a 5 pack-year smoking history. He has never used smokeless tobacco. He reports that he drinks alcohol. He reports that he uses illicit drugs (Marijuana).  Allergies: No Known Allergies  Medications: I have reviewed the patient's current medications.  Results for orders placed or performed during the hospital encounter of 10/27/14 (from the past 48 hour(s))  CBC with Differential/Platelet     Status: None   Collection Time: 10/27/14  1:21 PM  Result Value Ref Range   WBC 7.0 4.0 - 10.5 K/uL   RBC 4.81 4.22 - 5.81 MIL/uL   Hemoglobin 14.7 13.0 - 17.0 g/dL   HCT 43.2 39.0 - 52.0 %   MCV 89.8 78.0 - 100.0 fL   MCH 30.6 26.0 - 34.0 pg   MCHC 34.0 30.0 - 36.0 g/dL   RDW 12.7 11.5 - 15.5 %   Platelets 261 150 - 400 K/uL   Neutrophils Relative % 55 43 - 77 %   Neutro Abs 3.8 1.7 - 7.7 K/uL   Lymphocytes Relative 32 12 - 46 %   Lymphs Abs 2.3 0.7 - 4.0 K/uL   Monocytes Relative 10 3 - 12 %   Monocytes Absolute 0.7 0.1 - 1.0 K/uL   Eosinophils Relative  2 0 - 5 %   Eosinophils Absolute 0.2 0.0 - 0.7 K/uL   Basophils Relative 1 0 - 1 %   Basophils Absolute 0.0 0.0 - 0.1 K/uL  Basic metabolic panel     Status: Abnormal   Collection Time: 10/27/14  1:21 PM  Result Value Ref Range   Sodium 140 135 - 145 mmol/L   Potassium 4.1 3.5 - 5.1 mmol/L   Chloride 106 101 - 111 mmol/L   CO2 25 22 - 32 mmol/L   Glucose, Bld 109 (H) 65 - 99 mg/dL   BUN 11 6 - 20 mg/dL   Creatinine, Ser 1.34 (H) 0.61 - 1.24 mg/dL   Calcium 9.4 8.9 - 10.3 mg/dL   GFR calc non Af Amer 57 (L) >60 mL/min   GFR calc Af Amer >60 >60 mL/min    Comment: (NOTE) The eGFR has been calculated using the CKD EPI equation. This calculation has not been validated in all clinical situations. eGFR's persistently <60 mL/min signify possible Chronic Kidney Disease.    Anion gap 9 5 - 15  Urinalysis, Routine w reflex microscopic     Status: Abnormal   Collection Time: 10/28/14  1:23 AM  Result Value Ref Range   Color, Urine YELLOW YELLOW   APPearance CLEAR  CLEAR   Specific Gravity, Urine 1.022 1.005 - 1.030   pH 6.0 5.0 - 8.0   Glucose, UA 100 (A) NEGATIVE mg/dL   Hgb urine dipstick NEGATIVE NEGATIVE   Bilirubin Urine SMALL (A) NEGATIVE   Ketones, ur 15 (A) NEGATIVE mg/dL   Protein, ur NEGATIVE NEGATIVE mg/dL   Urobilinogen, UA 1.0 0.0 - 1.0 mg/dL   Nitrite NEGATIVE NEGATIVE   Leukocytes, UA NEGATIVE NEGATIVE    Comment: MICROSCOPIC NOT DONE ON URINES WITH NEGATIVE PROTEIN, BLOOD, LEUKOCYTES, NITRITE, OR GLUCOSE <1000 mg/dL.  CBC     Status: Abnormal   Collection Time: 10/28/14  4:30 AM  Result Value Ref Range   WBC 10.7 (H) 4.0 - 10.5 K/uL   RBC 4.39 4.22 - 5.81 MIL/uL   Hemoglobin 13.4 13.0 - 17.0 g/dL   HCT 39.9 39.0 - 52.0 %   MCV 90.9 78.0 - 100.0 fL   MCH 30.5 26.0 - 34.0 pg   MCHC 33.6 30.0 - 36.0 g/dL   RDW 12.9 11.5 - 15.5 %   Platelets 208 150 - 400 K/uL  Basic metabolic panel     Status: Abnormal   Collection Time: 10/28/14  4:30 AM  Result Value Ref Range    Sodium 137 135 - 145 mmol/L   Potassium 3.6 3.5 - 5.1 mmol/L   Chloride 104 101 - 111 mmol/L   CO2 27 22 - 32 mmol/L   Glucose, Bld 125 (H) 65 - 99 mg/dL   BUN 13 6 - 20 mg/dL   Creatinine, Ser 1.03 0.61 - 1.24 mg/dL   Calcium 8.5 (L) 8.9 - 10.3 mg/dL   GFR calc non Af Amer >60 >60 mL/min   GFR calc Af Amer >60 >60 mL/min    Comment: (NOTE) The eGFR has been calculated using the CKD EPI equation. This calculation has not been validated in all clinical situations. eGFR's persistently <60 mL/min signify possible Chronic Kidney Disease.    Anion gap 6 5 - 15  Vit D  25 hydroxy (routine osteoporosis monitoring)     Status: Abnormal   Collection Time: 10/28/14  2:14 PM  Result Value Ref Range   Vit D, 25-Hydroxy 12.4 (L) 30.0 - 100.0 ng/mL    Comment: (NOTE) Vitamin D deficiency has been defined by the [place] practice guideline as a level of serum 25-OH vitamin D less than 20 ng/mL (1,2). The Endocrine Society went on to further define vitamin D insufficiency as a level between 21 and 29 ng/mL (2). 1. IOM (Institute of Medicine). 2010. Dietary reference   intakes for calcium and D. [place]: The   [employer]. 2. Holick MF, Binkley Knox, Bischoff-Ferrari HA, et al.   Evaluation, treatment, and prevention of vitamin D   deficiency: an Endocrine Society clinical practice   guideline. JCEM. 2011 Jul; 96(7):1911-30. Performed At: [facility] [place], [place] [id number] Lindon Romp MD [id number]   Urine rapid drug screen (hosp performed)     Status: Abnormal   Collection Time: 10/28/14  6:44 PM  Result Value Ref Range   Opiates POSITIVE (A) NONE DETECTED   Cocaine POSITIVE (A) NONE DETECTED   Benzodiazepines POSITIVE (A) NONE DETECTED   Amphetamines NONE DETECTED NONE DETECTED   Tetrahydrocannabinol NONE DETECTED NONE DETECTED   Barbiturates NONE DETECTED NONE DETECTED    Comment:         DRUG SCREEN FOR MEDICAL PURPOSES ONLY.  IF CONFIRMATION IS NEEDED FOR  ANY PURPOSE, NOTIFY LAB WITHIN 5 DAYS.        LOWEST DETECTABLE LIMITS FOR URINE DRUG SCREEN Drug Class       Cutoff (ng/mL) Amphetamine      1000 Barbiturate      200 Benzodiazepine   443 Tricyclics       154 Opiates          300 Cocaine          300 THC              50   CBC     Status: Abnormal   Collection Time: 10/29/14  4:46 AM  Result Value Ref Range   WBC 11.4 (H) 4.0 - 10.5 K/uL   RBC 3.65 (L) 4.22 - 5.81 MIL/uL   Hemoglobin 11.3 (L) 13.0 - 17.0 g/dL   HCT 33.4 (L) 39.0 - 52.0 %   MCV 91.5 78.0 - 100.0 fL   MCH 31.0 26.0 - 34.0 pg   MCHC 33.8 30.0 - 36.0 g/dL   RDW 12.9 11.5 - 15.5 %   Platelets 169 150 - 400 K/uL  Comprehensive metabolic panel     Status: Abnormal   Collection Time: 10/29/14  4:46 AM  Result Value Ref Range   Sodium 135 135 - 145 mmol/L   Potassium 3.8 3.5 - 5.1 mmol/L   Chloride 102 101 - 111 mmol/L   CO2 26 22 - 32 mmol/L   Glucose, Bld 115 (H) 65 - 99 mg/dL   BUN 12 6 - 20 mg/dL   Creatinine, Ser 1.16 0.61 - 1.24 mg/dL   Calcium 8.4 (L) 8.9 - 10.3 mg/dL   Total Protein 5.1 (L) 6.5 - 8.1 g/dL   Albumin 2.8 (L) 3.5 - 5.0 g/dL   AST 21 15 - 41 U/L   ALT 14 (L) 17 - 63 U/L   Alkaline Phosphatase 25 (L) 38 - 126 U/L   Total Bilirubin 0.6 0.3 - 1.2 mg/dL   GFR calc non Af Amer >60 >60 mL/min   GFR calc Af Amer >60 >60 mL/min    Comment: (NOTE) The eGFR has been calculated using the CKD EPI equation. This calculation has not been validated in all clinical situations. eGFR's persistently <60 mL/min signify possible Chronic Kidney Disease.    Anion gap 7 5 - 15    Dg Chest 1 View  10/27/2014   CLINICAL DATA:  Patient fell 16 feet from ladder  EXAM: CHEST  1 VIEW  COMPARISON:  None.  FINDINGS: Lungs are clear. Heart size and pulmonary vascularity are normal. No adenopathy. No pneumothorax. There is degenerative change in both shoulders. No acute fracture evident.   IMPRESSION: No edema or consolidation. No acute fracture evident. No pneumothorax.   Electronically Signed   By: Lowella Grip III M.D.   On: 10/27/2014 14:35   Dg Lumbar Spine 2-3 Views  10/27/2014   CLINICAL DATA:  Golden Circle from 16 foot ladder.  Low back pain.  EXAM: LUMBAR SPINE - 2-3 VIEW  COMPARISON:  02/20/2010  FINDINGS: There is a compression fracture through the L1 vertebral body with anterior wedging. Normal alignment. No fracture. No retropulsed fracture fragments. Disc spaces are maintained. SI joints are symmetric and unremarkable.  IMPRESSION: Mild to moderate compression fracture and anterior wedging at L1.   Electronically Signed   By: Rolm Baptise M.D.   On: 10/27/2014 14:54   Dg Pelvis 1-2 Views  10/27/2014   CLINICAL DATA:  Patient fell 16 feet from  ladder  EXAM: PELVIS - 1-2 VIEW  COMPARISON:  None.  FINDINGS: There is no demonstrable fracture or dislocation. There is slight symmetric narrowing of both hip joints. No erosive change.  IMPRESSION: Slight symmetric narrowing both hip joints. No fracture or dislocation.   Electronically Signed   By: Lowella Grip III M.D.   On: 10/27/2014 14:37   Dg Wrist Complete Left  10/28/2014   CLINICAL DATA:  Postop for left wrist fixation.  EXAM: LEFT WRIST - COMPLETE 3+ VIEW  COMPARISON:  Intraoperative study of earlier today and preoperative study of yesterday.  FINDINGS: Overlying splint obscures bony detail. Plate and screw fixation of the distal radius. Ulnar styloid fracture. No hardware complication. Alignment is improved, nearly anatomic.  IMPRESSION: Interval internal fixation of distal radius fracture.   Electronically Signed   By: Abigail Miyamoto M.D.   On: 10/28/2014 12:42   Dg Wrist Complete Left  10/27/2014   CLINICAL DATA:  Golden Circle off ladder trimming trees  EXAM: LEFT WRIST - COMPLETE 3+ VIEW  COMPARISON:  None.  FINDINGS: Four views of the left wrist submitted. There is impacted mild displaced fracture in distal left radius. Mild  displaced fracture of ulnar styloid.  IMPRESSION: Impacted mild displaced fracture in distal left radial metaphysis. Mild displaced fracture of ulnar styloid.   Electronically Signed   By: Lahoma Crocker M.D.   On: 10/27/2014 14:32   Dg Tibia/fibula Right   Dg Tibia/fibula Right  10/27/2014   CLINICAL DATA:  Fall from ladder with right proximal tibial pain.  EXAM: RIGHT TIBIA AND FIBULA - 2 VIEW  COMPARISON:  None.  FINDINGS: Proximal tibial and fibular fractures are partially imaged. Remainders of the tibia and fibula are intact.  IMPRESSION: 1. Proximal tibial and fibular fractures better evaluated on dedicated views of the right knee performed the same day. 2. No additional evidence of an acute fracture.   Electronically Signed   By: Lorin Picket M.D.   On: 10/27/2014 14:35   Ct Cervical Spine Wo Contrast  10/27/2014   CLINICAL DATA:  Golden Circle out of a tree while cutting branches, landing on his feet. Lower lumbar pain. Initial encounter.  EXAM: CT CERVICAL SPINE WITHOUT CONTRAST  TECHNIQUE: Multidetector CT imaging of the cervical spine was performed without intravenous contrast. Multiplanar CT image reconstructions were also generated.  COMPARISON:  02/20/2010  FINDINGS: Vertebral alignment is unchanged without significant listhesis. There is mild straightening of the normal cervical lordosis. Moderate disc space narrowing is again seen at C5-6 and C6-7 with degenerative endplate spurring. There is likely moderate spinal stenosis at these 2 levels. No acute cervical spine fracture is identified. Moderate bilateral facet arthrosis is present at C7-T1. Paraspinal soft tissues are unremarkable.  IMPRESSION: 1. No acute osseous abnormality identified in the cervical spine. 2. Moderate disc degeneration at C5-6 and C6-7.   Electronically Signed   By: Logan Bores   On: 10/27/2014 17:31   Ct Thoracic Spine Wo Contrast  10/27/2014   CLINICAL DATA:  Gold[place] out of a tree while cutting branches, landing on his feet.  Lower lumbar pain. Initial encounter.  EXAM: CT THORACIC SPINE WITHOUT CONTRAST  TECHNIQUE: Multidetector CT imaging of the thoracic spine was performed without intravenous contrast administration. Multiplanar CT image reconstructions were also generated.  COMPARISON:  Chest radiograph earlier today  FINDINGS: There is mild left convex curvature of the upper thoracic spine. There is no listhesis. Thoracic vertebral body heights are preserved without evidence of compression fracture. Multiple small  Schmorl's nodes and mild degenerative endplate osteophytosis are present in the mid and lower thoracic spine. No thoracic spine fracture is identified. Mild paraseptal emphysematous changes are noted in the lung apices. Dependent subsegmental atelectasis is present in both lungs.  IMPRESSION: No acute osseous abnormality identified in the thoracic spine. Mild spondylosis.   Electronically Signed   By: Logan Bores   On: 10/27/2014 17:36   Ct Lumbar Spine Wo Contrast  10/27/2014   CLINICAL DATA:  [place] out of a tree while cutting branches, landing on his feet. Lower lumbar pain. Initial encounter.  EXAM: CT LUMBAR SPINE WITHOUT CONTRAST  TECHNIQUE: Multidetector CT imaging of the lumbar spine was performed without intravenous contrast administration. Multiplanar CT image reconstructions were also generated.  COMPARISON:  Lumbar spine radiographs earlier today  FINDINGS: Vertebral alignment is within normal limits. As described on radiographs earlier today, there is an acute compression fracture involving the L1 superior endplate with approximately 40% height loss. The fracture also extends in a vertical nondisplaced manner to involve the inferior endplate without inferior endplate compression. There is no significant retropulsion. No definite acute fracture is identified involving the posterior elements at this level. There is some cortical irregularity and mild osseous overgrowth involving the lateral aspect of the left  L1 pedicle with a transverse cortical disruption along the left L1 transverse process which has sclerotic margins and may reflect remote trauma or be developmental.  Other lumbar vertebral body heights are preserved. Intervertebral disc space heights are preserved. There is mild paravertebral hematoma L1. Moderate aortoiliac atherosclerotic calcification is noted.  IMPRESSION: Acute, moderate L1 compression fracture. Changes involving the left-sided L1 posterior elements are felt to be chronic.   Electronically Signed   By: Logan Bores   On: 10/27/2014 17:27   Ct Knee Right Wo Contrast  10/27/2014   CLINICAL DATA:  Fall from a tree.  Right knee pain  EXAM: CT OF THE right KNEE WITHOUT CONTRAST  TECHNIQUE: Multidetector CT imaging of the right knee was performed according to the standard protocol. Multiplanar CT image reconstructions were also generated.  COMPARISON:  10/27/2014 conventional radiographs  FINDINGS: Schatzker 5 fracture of the tibial plateau, comminuted, with a transverse component extending through the medial tibial plateau ; a parasagittal component connecting this to the anterior tibial plateau; and an oblique component extending this combined fracture to the posterolateral tibial plateau. Fractures extend through the tibial spine anteriorly. There is 5 mm depression of the posterior fragment from the medial tibial plateau, and 4 mm depression of the anterior fragment from the lateral tibial plateau. The fractures extend into the lateral margin of the tibial tubercle.  Prominently comminuted proximal fibular fracture, with the fibular head and multiple fragments which are moderately spread. The dominant separated anterior fragment is displaced about 5 mm and somewhat shattered posterior fragments are mildly posteriorly displaced and overlapped with the adjacent shaft.  No fracture of the distal femur or patella observed. As expected there is a large lipohemarthrosis. The fibular collateral  ligament and the biceps femora is attached to proximal fibular fragments. A Baker's cyst containing blood products is present.  IMPRESSION: 1. Schatzker 5 fracture of the tibial plateau, with both medial and lateral tibial plateaus fracture with a moderate degree of combination. 5 mm depression of the posterior fragment of the medial tibial plateau ; 4 mm depression anterior fragment of the lateral tibial plateau. 2. Prominently comminuted proximal fibular fracture. This involves the proximal fibular head and metaphysis. 3. Large lipohemarthrosis.  Blood products in a Baker's cyst.   Electronically Signed   By: Van Clines M.D.   On: 10/27/2014 17:32   Dg Knee Complete 4 Views Right  10/27/2014   CLINICAL DATA:  Status post fall from a 16 foot lateral while trimming training is unable to extend the leg at the knee  EXAM: RIGHT KNEE - COMPLETE 4+ VIEW  COMPARISON:  None.  FINDINGS: The patient has sustained an acute impacted fracture of the proximal tibial metaphysis. A fracture line extends obliquely both medially and laterally from the intercondylar notch region with avulsion of the intercondylar notches. There is a fracture of the fibular head. The femoral condyles and metaphysis are intact.  IMPRESSION: The patient has sustained acute tibial plateau fractures extending inferiorly both medially and laterally. There is also an acute fracture of the fibular head. The distal femur is intact.   Electronically Signed   By: David  Martinique M.D.   On: 10/27/2014 14:31   Dg Foot Complete Right  10/27/2014   CLINICAL DATA:  Patient fell off 16 foot ladder  EXAM: RIGHT FOOT COMPLETE - 3+ VIEW  COMPARISON:  None.  FINDINGS: Frontal, oblique, and lateral views obtained. There is no demonstrable fracture or dislocation. Joint spaces appear intact. No erosive change.  IMPRESSION: No fracture or dislocation.  No appreciable arthropathy.   Electronically Signed   By: Lowella Grip III M.D.   On: 10/27/2014 14:34     ROS as noted above Blood pressure 99/63, pulse 72, temperature 98 F (36.7 C), temperature source Oral, resp. rate 16, height 5' 10"  (1.778 m), weight 158 lb 8.2 oz (71.9 kg), SpO2 98 %. Physical Exam  LUEx shoulder, elbow, wrist, digits- no skin wounds, nontender, no instability, no blocks to motion  Splint in place  Sens  Ax/R/M/U intact  Mot   Ax/ R/ PIN/ M/ AIN/ U intact  Rad 2+ RUEx shoulder, elbow, wrist, digits- no skin wounds, nontender, no instability, no blocks to motion  Sens  Ax/R/M/U intact  Mot   Ax/ R/ PIN/ M/ AIN/ U intact  Rad 2+ Pelvis--no traumatic wounds or rash, no ecchymosis, stable to manual stress, nontender RLE Flexed to 100 degrees left knee  Tender  ICE pack and ACE  Sens DPN, SPN, TN intact  Motor EHL, ext, flex, evers intact  DP 2+, PT 2+, mild edema LLE No traumatic wounds, ecchymosis, or rash  Nontender  No effusions  Knee stable to varus/ valgus and anterior/posterior stress  Sens DPN, SPN, TN intact  Motor EHL, ext, flex, evers 5/5  DP 2+, PT 2+, No significant edema      I discussed with the patient the risks and benefits of surgery for his left wrist and right knee, including the possibility of infection, nerve injury, vessel injury, wound breakdown, arthritis, symptomatic hardware, DVT/ PE, loss of motion, and need for further surgery among others.  We also specifically discussed the need to stage surgery because of the elevated risk of soft tissue breakdown that could lead to amputation.  He understood these risks and wished to proceed.   Rozanna Box, MD 10/28/2014 7:49 AM

## 2014-10-29 ENCOUNTER — Encounter (HOSPITAL_COMMUNITY): Payer: Self-pay | Admitting: Orthopedic Surgery

## 2014-10-29 DIAGNOSIS — D62 Acute posthemorrhagic anemia: Secondary | ICD-10-CM | POA: Diagnosis not present

## 2014-10-29 LAB — COMPREHENSIVE METABOLIC PANEL
ALBUMIN: 2.8 g/dL — AB (ref 3.5–5.0)
ALK PHOS: 25 U/L — AB (ref 38–126)
ALT: 14 U/L — ABNORMAL LOW (ref 17–63)
AST: 21 U/L (ref 15–41)
Anion gap: 7 (ref 5–15)
BILIRUBIN TOTAL: 0.6 mg/dL (ref 0.3–1.2)
BUN: 12 mg/dL (ref 6–20)
CO2: 26 mmol/L (ref 22–32)
CREATININE: 1.16 mg/dL (ref 0.61–1.24)
Calcium: 8.4 mg/dL — ABNORMAL LOW (ref 8.9–10.3)
Chloride: 102 mmol/L (ref 101–111)
Glucose, Bld: 115 mg/dL — ABNORMAL HIGH (ref 65–99)
Potassium: 3.8 mmol/L (ref 3.5–5.1)
Sodium: 135 mmol/L (ref 135–145)
Total Protein: 5.1 g/dL — ABNORMAL LOW (ref 6.5–8.1)

## 2014-10-29 LAB — CBC
HEMATOCRIT: 33.4 % — AB (ref 39.0–52.0)
Hemoglobin: 11.3 g/dL — ABNORMAL LOW (ref 13.0–17.0)
MCH: 31 pg (ref 26.0–34.0)
MCHC: 33.8 g/dL (ref 30.0–36.0)
MCV: 91.5 fL (ref 78.0–100.0)
Platelets: 169 10*3/uL (ref 150–400)
RBC: 3.65 MIL/uL — AB (ref 4.22–5.81)
RDW: 12.9 % (ref 11.5–15.5)
WBC: 11.4 10*3/uL — ABNORMAL HIGH (ref 4.0–10.5)

## 2014-10-29 LAB — VITAMIN D 25 HYDROXY (VIT D DEFICIENCY, FRACTURES): Vit D, 25-Hydroxy: 12.4 ng/mL — ABNORMAL LOW (ref 30.0–100.0)

## 2014-10-29 NOTE — Progress Notes (Signed)
Patient ID: [name] CoonsMichael E Dibartolo, male   DOB: [birth year], 59 y.o.   MRN: [id number]   LOS: 2 days   Subjective: No unexpected c/o. Pain controlled.   Objective: Vital signs in last 24 hours: Temp:  [98 F (36.7 C)-98.7 F (37.1 C)] 98 F (36.7 C) (05/27 0558) Pulse Rate:  [56-80] 72 (05/27 0558) Resp:  [10-16] 16 (05/27 0558) BP: (99-121)/(63-82) 99/63 mmHg (05/27 0558) SpO2:  [94 %-99 %] 98 % (05/27 0558) Last BM Date: 10/26/14   Laboratory  CBC  Recent Labs  10/28/14 0430 10/29/14 0446  WBC 10.7* 11.4*  HGB 13.4 11.3*  HCT 39.9 33.4*  PLT 208 169   BMET  Recent Labs  10/28/14 0430 10/29/14 0446  NA 137 135  K 3.6 3.8  CL 104 102  CO2 27 26  GLUCOSE 125* 115*  BUN 13 12  CREATININE 1.03 1.16  CALCIUM 8.5* 8.4*    Physical Exam General appearance: alert and no distress Resp: clear to auscultation bilaterally Cardio: regular rate and rhythm GI: normal findings: bowel sounds normal and soft, non-tender Extremities: Some numbness right foot   Assessment/Plan: Fall Left wrist fx s/p ORIF -- per Dr. Carola FrostHandy, NWB but can WBAT through elbow L1 compression fx -- TLSO per Dr. Yetta BarreJones Right tibia plateau/fibula fx s/p ex fix -- Plan definitive repair Tuesday per Dr. Carola FrostHandy, NWB ABL anemia -- Mild, check tomorrow FEN -- D/C foley VTE -- SCD's, Lovenox Dispo -- PT/OT    Freeman CaldronMichael J. Shams Fill, PA-C Pager: [phone] General Trauma PA Pager: [phone]  10/29/2014

## 2014-10-29 NOTE — Op Note (Signed)
NAMGaylene [name]:  [name] [name]             ACCOUNT NO.:  [id number]  MEDICAL RECORD NO.:  [id number]  LOCATION:  [id number]                        FACILITY:  [facility]  PHYSICIAN:  Doralee AlbinoMichael H. Carola FrostHandy, M.D. DATE OF BIRTH:  Aug 16, 1955  DATE OF PROCEDURE:  10/28/2014 DATE OF DISCHARGE:                              OPERATIVE REPORT   PREOPERATIVE DIAGNOSES: 1. Right bicondylar tibial plateau fracture. 2. Left distal radius fracture.  POSTOPERATIVE DIAGNOSES: 1. Right bicondylar tibial plateau fracture. 2. Left distal radius fracture. 3. Right leg anterior and lateral compartment syndrome.  PROCEDURE: 1. Closed reduction of right tibial plateau fracture. 2. Application of spanning external fixator, right leg. 3. Open reduction and internal fixation of left distal radius     fracture. 4. Fasciotomy of anterior and lateral compartments. 5. Measurement of right leg compartments with needle manometer.  SURGEON:  Doralee AlbinoMichael H. Carola FrostHandy, M.D.  ASSISTANT:  Mearl LatinKeith W Paul, PA-C.  ANESTHESIA:  General.  INPUT/OUTPUT:  1000 mL crystalloid/UOP 400, EBL 75.  SPECIMENS:  None.  TOURNIQUET:  None.  DISPOSITION:  PACU.  CONDITION:  Stable.  BRIEF SUMMARY AND INDICATION OF PROCEDURE:  [name] [name] is a 59 year old male who fell over 20 feet sustaining a lumbar compression fracture and right bicondylar plateau and left distal radius.  He was initially seen and evaluated by Dr. Malon KindleSteven Norris who requested further evaluation and management by the Orthopedic Trauma Service.  I saw the patient in consultation, recommended either primary ORIF or spanning external fixation depending upon the soft tissues.  At this point, I saw him this morning.  The patient had spent the night with his right knee flexed to over 115 degrees with ice packs on it, but had declined to extend his leg so that it could be immobilized in a more optimal position.  He was comfortable with respect both to the left wrist and right foot  reporting some minimal paresthesia dorsally and denying any in the wrist.  I did discuss risks and benefits of surgical treatment including possible infection, nerve injury, vessel injury, DVT, PE, heart attack, stroke, arthritis, symptomatic hardware, need for further surgery, and malunion, nonunion, among others.  The patient acknowledged these risks and did wish to proceed.  BRIEF SUMMARY OF PROCEDURE:  [name] [name] was given preoperative antibiotics, taken to the operating room where general anesthesia was induced.  His left upper extremity and right lower extremity were prepped and draped in usual sterile fashion.  There was quite a bit of dirt and this required a couple of chlorhexidine scrub brushes prior to standard prep and drape.  The right knee was brought into full extension, was unstable to varus or valgus testing, and the medial shear fragment was short.  The compartments were quite firm, specifically on the anterior and lateral side, the posterior remained soft.  This was concerning for compartment syndrome.  Consequently, neo manometer measurements were obtained.  The posterior was below the threshold at 34, but the anterior was elevated at 48, and the lateral at 39. Consequently, incision was made to proceed with external fixation in lieu of definitive ORIF.  The soft tissues about the knee did have adequate skin wrinkling because of the risk of infection  with associated compartment syndrome.  The leg was manipulated, brought in full extension and angulation corrected.  Bump was placed under the knee to facilitate this, which is very slight flexion.  My assistant maintained reduction while I applied the clamps and bars.  Final images showed appropriate restoration of length and alignment as well as fracture reduction.  A 12 cm incision was made over the lateral aspect of the leg between the fibular head and distal fibula.  Dissection was carried down to the fascia  elevating the subcu off it along the anterior compartment as well as the lateral and then the compartments were incised with the scissor tip pointing away from the superficial peroneal nerve.  It was released along its entirety of the incision as well as nearly 10 cm proximal and distal because of the long Metzenbaum scissors.  There is no bleeding or other complications related to this.  Wound was loosely reapproximated and the muscle was viable and briskly contractile to cautery, healthy and pink.  Attention was turned to the left distal radius where a 5 cm of volar approach was made.  Dissection was carried down to the FCR tendon sheath which was incised.  The tendon was retracted.  The deep aspect of the sheath incised.  Pronator was released along the radial border and swept ulnarly.  Reduction maneuver was performed under direct visualization and secured with a K-wire.  Retraction was produced and maintained by my assistant using [employer] as well as a baby Homan, and also manually helping to restore appropriate radial inclination.  Reduction and plate placement were checked provisionally with C-arm on both AP and lateral images. The fracture was secured with standard screws proximally and locked pegs distally.  Final images showed appropriate reduction, hardware placement, trajectory and length.  A standard layered closure was performed after thorough irrigation using 2-0 Vicryl and 3-0 nylon. Sterile gently compressive dressing and then a volar splint were applied with the patient's wrist in slight extension.  PROGNOSIS:  [name] [name] will remain in the hospital in the Trauma Service and mobilize as allowed with the nurse, neurosurgeon and General Surgery Trauma Service.  We will have him weightbearing as tolerated through the left elbow and nonweightbearing on the right leg.  We do anticipate return to the OR and this could be done as early as next week for definitive treatment,  if amendable soft tissue. I suspect that the patient's leg will benefit from  being in a better position now, and that his soft tissue swelling will improve at a rapid pace.     Doralee Albino. Carola Frost, M.D.     [facility]/MEDQ  D:  10/28/2014  T:  10/29/2014  Job:  [id number]

## 2014-10-29 NOTE — Progress Notes (Signed)
Occupational Therapy Treatment Patient Details Name: [name] [name] MRN: [id number] DOB: Nov 05, 1955 Today's Date: 10/29/2014    History of present illness [name] [name] was on an extension ladder 15-20 feet high cutting a limb when it came down and struck the ladder. He jumped from the ladder and landed first on his feet and then fell backwards bracing himself with his arms. s/p left ORIF distal radius fx, external  fixator right tibia, moderate L1 anterior wedge compression fracture   OT comments  Pt mentioned that his ortho doc had told him he was going to put some strap on his right foot to help keep his foot up with the external fixator on. PAC for Dr. Carola Frost [name] [name]) was asked and he said that pt did need a foot strap for his RLE. This was fabricated this session with pt and nurse educated on how to loosen/undo it prn for comfort.   Follow Up Recommendations  No OT follow up    Equipment Recommendations  3 in 1 bedside comode;Wheelchair (measurements OT);Wheelchair cushion (measurements OT) (elevating leg rests)       Precautions / Restrictions Precautions Precautions: Back Precaution Booklet Issued: No Required Braces or Orthoses: Spinal Brace Spinal Brace: Applied in sitting position;Thoracolumbosacral orthotic Restrictions Weight Bearing Restrictions: Yes LUE Weight Bearing: Weight bear through elbow only RLE Weight Bearing: Non weight bearing                              Cognition   Behavior During Therapy: WFL for tasks assessed/performed Overall Cognitive Status: Within Functional Limits for tasks assessed                                    Home Living Family/patient expects to be discharged to:: Private residence Living Arrangements: Alone Available Help at Discharge: Family;Available PRN/intermittently Type of Home: House Home Access: Level entry     Home Layout: One level               Home Equipment: Hand held shower head    Additional Comments: says a W/C will fit in his home       Prior Functioning/Environment Level of Independence: Independent            Frequency Min 2X/week     Progress Toward Goals  OT Goals(current goals can now be found in the care plan section)  Progress towards OT goals:  (session for foot positioning with leg with external fixator)  Acute Rehab OT Goals Patient Stated Goal: to get moving OT Goal Formulation: With patient Time For Goal Achievement: 11/12/14 Potential to Achieve Goals: Good ADL Goals Pt Will Perform Grooming: with modified independence;standing (2 tasks) Pt Will Perform Lower Body Dressing: with modified independence;with adaptive equipment;sit to/from stand Pt Will Transfer to Toilet: with modified independence;ambulating;bedside commode (over toilet) Pt Will Perform Toileting - Clothing Manipulation and hygiene: with modified independence;sit to/from stand Additional ADL Goal #1: Pt will be able to get in and OOB for BADLs  Plan Discharge plan remains appropriate    Co-evaluation    PT/OT/SLP Co-Evaluation/Treatment: Yes (partial) Reason for Co-Treatment: For patient/therapist safety PT goals addressed during session: Mobility/safety with mobility;Proper use of DME;Strengthening/ROM OT goals addressed during session: ADL's and self-care;Strengthening/ROM      End of Session   Activity Tolerance Patient tolerated treatment well   Patient Left in bed;with call bell/phone  within reach   Nurse Communication  (how to adjust foot strap on external fixator)        Time: [id number] OT Time Calculation (min): 12 min  Charges: OT General Charges $OT Visit: 1 Procedure OT Evaluation $Initial OT Evaluation Tier I: 1 Procedure OT Treatments $Orthotics Fit/Training: 8-22 mins  Evette GeorgesLeonard, Artyom Stencel Eva [phone] 10/29/2014, 3:26 PM

## 2014-10-29 NOTE — Evaluation (Signed)
Physical Therapy Evaluation Patient Details Name: [name] CoonsMichael E [name] MRN: [id number] DOB: 1956/01/02 Today's Date: 10/29/2014   History of Present Illness  [name] NeedleMichael was on an extension ladder 15-20 feet high cutting a limb when it came down and struck the ladder. He jumped from the ladder and landed first on his feet and then fell backwards bracing himself with his arms. s/p left ORIF distal radius fx, external  fixator right tibia, moderate L1 anterior wedge compression fracture    Clinical Impression  Patient presents with NWB LUE (WB through elbow only), NWB RLE and TLSO required when OOB impacting safe mobility. Tolerated transfers and ambulation with Min A for safety. Planned to return to OR on Tuesday (5/31) to remove external fixator. Would benefit from skilled PT to improve transfers, balance, gait and overall mobility so pt can maximize independence prior to return home.    Follow Up Recommendations No PT follow up    Equipment Recommendations  Other (comment);Rolling walker with 5" wheels;Wheelchair (measurements PT);Wheelchair cushion (measurements PT) (left platform RW; elevating leg rests for w/c)    Recommendations for Other Services       Precautions / Restrictions Precautions Precautions: Back Precaution Booklet Issued: No Required Braces or Orthoses: Spinal Brace Spinal Brace: Applied in sitting position;Thoracolumbosacral orthotic Restrictions Weight Bearing Restrictions: Yes LUE Weight Bearing: Weight bear through elbow only RLE Weight Bearing: Non weight bearing      Mobility  Bed Mobility Overal bed mobility: Needs Assistance Bed Mobility: Supine to Sit     Supine to sit: Min assist     General bed mobility comments: for RLE and technique  Transfers Overall transfer level: Needs assistance Equipment used: Left platform walker Transfers: Sit to/from Stand Sit to Stand: Min assist         General transfer comment: Cues for proper hand positioning.  Compliant with NWB RLE.  Ambulation/Gait Ambulation/Gait assistance: Min guard Ambulation Distance (Feet): 120 Feet Assistive device: Left platform walker Gait Pattern/deviations: Step-to pattern   Gait velocity interpretation: Below normal speed for age/gender General Gait Details: Hop to gait pattern, steady. Fatigues. Compliant with NWB RLE.  Stairs            Wheelchair Mobility    Modified Rankin (Stroke Patients Only)       Balance Overall balance assessment: Needs assistance Sitting-balance support: Feet supported;No upper extremity supported Sitting balance-Leahy Scale: Fair     Standing balance support: During functional activity Standing balance-Leahy Scale: Poor Standing balance comment: Relient on platform walker for support.                              Pertinent Vitals/Pain Pain Assessment: 0-10 Pain Score: 3  Pain Location: right thigh Pain Descriptors / Indicators: Sore;Aching Pain Intervention(s): Monitored during session;Repositioned;Ice applied    Home Living Family/patient expects to be discharged to:: Private residence Living Arrangements: Alone Available Help at Discharge: Family;Available PRN/intermittently Type of Home: House Home Access: Level entry     Home Layout: One level Home Equipment: Hand held shower head Additional Comments: says a W/C will fit in his home     Prior Function Level of Independence: Independent               Hand Dominance   Dominant Hand: Right    Extremity/Trunk Assessment   Upper Extremity Assessment: Defer to OT evaluation       LUE Deficits / Details: cast proximal to MCPs up to  distal to elbow   Lower Extremity Assessment: RLE deficits/detail RLE Deficits / Details: Ankle AROM WFL. Knee and hip AROM not fully assessed secondary to external fixator.       Communication   Communication: No difficulties  Cognition Arousal/Alertness: Awake/alert Behavior During Therapy:  WFL for tasks assessed/performed Overall Cognitive Status: Within Functional Limits for tasks assessed                      General Comments      Exercises        Assessment/Plan    PT Assessment Patient needs continued PT services  PT Diagnosis Acute pain;Abnormality of gait   PT Problem List Decreased strength;Pain;Decreased range of motion;Decreased balance;Decreased mobility;Decreased activity tolerance;Decreased knowledge of use of DME  PT Treatment Interventions Balance training;Gait training;Functional mobility training;Therapeutic activities;Therapeutic exercise;Patient/family education;Wheelchair mobility training;DME instruction   PT Goals (Current goals can be found in the Care Plan section) Acute Rehab PT Goals Patient Stated Goal: to get moving PT Goal Formulation: With patient Time For Goal Achievement: 11/12/14 Potential to Achieve Goals: Good    Frequency Min 3X/week   Barriers to discharge Decreased caregiver support Pt lives alone but reports having help from family members/friends.    Co-evaluation PT/OT/SLP Co-Evaluation/Treatment: Yes Reason for Co-Treatment: For patient/therapist safety PT goals addressed during session: Mobility/safety with mobility;Proper use of DME;Strengthening/ROM OT goals addressed during session: ADL's and self-care;Strengthening/ROM       End of Session Equipment Utilized During Treatment: Gait belt;Back brace;Other (comment) (ex fixator) Activity Tolerance: Patient tolerated treatment well Patient left: in chair;with call bell/phone within reach Nurse Communication: Mobility status;Weight bearing status;Precautions         Time: 1104-1130 PT Time Calculation (min) (ACUTE ONLY): 26 min   Charges:   PT Evaluation $Initial PT Evaluation Tier I: 1 Procedure     PT G Codes:        Taye Cato A Journey Ratterman 10/29/2014, 1:11 PM Mylo Red, PT, DPT [phone]

## 2014-10-29 NOTE — Anesthesia Postprocedure Evaluation (Signed)
  Anesthesia Post-op Note  Patient: [name] [name]  Procedure(s) Performed: Procedure(s) (LRB): EXTERNAL FIXATION LEG (Right) OPEN REDUCTION INTERNAL FIXATION (ORIF) DISTAL RADIAL FRACTURE (Left) FASCIOTOMY (Right)  Patient Location: PACU  Anesthesia Type: General  Level of Consciousness: awake and alert   Airway and Oxygen Therapy: Patient Spontanous Breathing  Post-op Pain: mild  Post-op Assessment: Post-op Vital signs reviewed, Patient's Cardiovascular Status Stable, Respiratory Function Stable, Patent Airway and No signs of Nausea or vomiting  Last Vitals:  Filed Vitals:   10/29/14 1300  BP: 137/73  Pulse: 79  Temp: 36.7 C  Resp: 16    Post-op Vital Signs: stable   Complications: No apparent anesthesia complications

## 2014-10-29 NOTE — Progress Notes (Signed)
Orthopaedic Trauma Service Progress Note  Subjective  Doing well Pain controlled No specific complaints Denies injuries elsewhere   Tingling dorsum or R foot, 1st webspace  ROS As above   Objective   BP 99/63 mmHg  Pulse 72  Temp(Src) 98 F (36.7 C) (Oral)  Resp 16  Ht 5' 10"  (1.778 m)  Wt 71.9 kg (158 lb 8.2 oz)  BMI 22.74 kg/m2  SpO2 98%  Intake/Output      05/26 0701 - 05/27 0700 05/27 0701 - 05/28 0700   P.O. 120 240   I.V. (mL/kg) 1200 (16.7)    IV Piggyback 150    Total Intake(mL/kg) 1470 (20.4) 240 (3.3)   Urine (mL/kg/hr) 1450 (0.8)    Blood 75 (0)    Total Output 1525     Net -55 +240          Labs  Results for [name] [name] (MRN [id number]) as of 10/29/2014 09:38  Ref. Range 10/28/2014 14:14 10/28/2014 18:44 10/29/2014 04:46  Sodium Latest Ref Range: 135-145 mmol/L   135  Potassium Latest Ref Range: 3.5-5.1 mmol/L   3.8  Chloride Latest Ref Range: 101-111 mmol/L   102  CO2 Latest Ref Range: 22-32 mmol/L   26  BUN Latest Ref Range: 6-20 mg/dL   12  Creatinine Latest Ref Range: 0.61-1.24 mg/dL   1.16  Calcium Latest Ref Range: 8.9-10.3 mg/dL   8.4 (L)  EGFR (Non-African Amer.) Latest Ref Range: >60 mL/min   >60  EGFR (African American) Latest Ref Range: >60 mL/min   >60  Glucose Latest Ref Range: 65-99 mg/dL   115 (H)  Anion gap Latest Ref Range: 5-15    7  Alkaline Phosphatase Latest Ref Range: 38-126 U/L   25 (L)  Albumin Latest Ref Range: 3.5-5.0 g/dL   2.8 (L)  AST Latest Ref Range: 15-41 U/L   21  ALT Latest Ref Range: 17-63 U/L   14 (L)  Total Protein Latest Ref Range: 6.5-8.1 g/dL   5.1 (L)  Total Bilirubin Latest Ref Range: 0.3-1.2 mg/dL   0.6  Vit D, 25-Hydroxy Latest Ref Range: 30.0-100.0 ng/mL 12.4 (L)    WBC Latest Ref Range: 4.0-10.5 K/uL   11.4 (H)  RBC Latest Ref Range: 4.22-5.81 MIL/uL   3.65 (L)  Hemoglobin Latest Ref Range: 13.0-17.0 g/dL   11.3 (L)  HCT Latest Ref Range: 39.0-52.0 %   33.4 (L)  MCV Latest Ref Range:  78.0-100.0 fL   91.5  MCH Latest Ref Range: 26.0-34.0 pg   31.0  MCHC Latest Ref Range: 30.0-36.0 g/dL   33.8  RDW Latest Ref Range: 11.5-15.5 %   12.9  Platelets Latest Ref Range: 150-400 K/uL   169  Amphetamines Latest Ref Range: NONE DETECTED   NONE DETECTED   Barbiturates Latest Ref Range: NONE DETECTED   NONE DETECTED   Benzodiazepines Latest Ref Range: NONE DETECTED   POSITIVE (A)   Opiates Latest Ref Range: NONE DETECTED   POSITIVE (A)   COCAINE Latest Ref Range: NONE DETECTED   POSITIVE (A)   Tetrahydrocannabinol Latest Ref Range: NONE DETECTED   NONE DETECTED     Exam  Gen: awake and alert, appears comfortable, NAD Lungs: clear anterior fields  Cardiac: reg, s1 and s2 Abd: +BS, NTND Pelvis: stable, no instability of bony pelvis noted, no pain with manipulation Ext:   Right Upper Extremity    No pain with eval   No crepitus or gross motion    No swelling or deformity  Ext warm   Motor and sensory functions intact     Left Upper Extremity    Splint fitting well   Radial, ulnar, median nv motor and sensory functions intact   Brisk cap refill   Ext wrm   Swelling stable     Right Lower Extremity    Ex fix stable   Dressings c/d/i   Ext warm   + DP pulse   Dec DPN sensation    SPN and TN sensation intact   Good EHL function, ok ankle extension    Lesser toe motor function intact   AT/PT/Peroneals/gastroc motor functions intact    Heel sore   No crepitus noted with ankle or foot exam    Left Lower Extremity    No acute findings   No pain with axial loading of L leg   No pain or gross motion with manipulation   nontender exam   Motor and sensory functions grossly intact   Ext warm    + DP pulse      Assessment and Plan   POD/HD#: 1  59 y/o male s/p fall from ladder  1. Fall from Ladder   2. Comminuted bicondylar R tibial plateau fracture with compartment syndrome   S/p ex fix and fasciotomies     Return to OR Tuesday for ORIF   Aggressive ice  and elevation over weekend  [facility] to move toes and ankle  Will have foot strap made given DPN symptoms   NWB for now and 8 weeks post op   PT/OT evals        commintued L distal radius fx s/p ORIF  NWB thru wrist  WBAT thru elbow  Finger, elbow, shoulder motion ok  Ice and elevate        L1 compression fx   TLSO per NS   3. Pain management:  Continue per TS   4. ABL anemia/Hemodynamics  Stable   5. Medical issues   Substance abuse based on tox screen- opiates and benzos could be reflective of meds given at hospital but pt also + for cocaine   SW eval   6. DVT/PE prophylaxis:  Lovenox for now   7. ID:   Periop abx   8. Metabolic Bone Disease:  Vitamin D deficiency    Will supplement   Check additional labs  9. Activity:  As per #2  10. FEN/Foley/Lines:  Diet as tolerated  11. Dispo:  Return to OR Tuesday for ORIF R tibial plateau     Jari Pigg, PA-C [facility] [phone] (O) 10/29/2014 9:36 AM

## 2014-10-29 NOTE — Evaluation (Signed)
Occupational Therapy Evaluation Patient Details Name: [name] [name] MRN: [id number] DOB: Jan 29, 1956 Today's Date: 10/29/2014    History of Present Illness [name] [name] was on an extension ladder 15-20 feet high cutting a limb when it came down and struck the ladder. He jumped from the ladder and landed first on his feet and then fell backwards bracing himself with his arms. s/p left ORIF distal radius fx, external  fixator right tibia, moderate L1 anterior wedge compression fracture   Clinical Impression   This 59 yo male admitted with above presents to acute OT with NWB'ing RLE, WBing through elbow only LUE, TLSO on when up, decreased mobility, decreased balance, increased pain all affecting his ability to care for himself at home. He will benefit from acute OT without need for follow up.    Follow Up Recommendations  No OT follow up    Equipment Recommendations  3 in 1 bedside comode;Wheelchair (measurements OT);Wheelchair cushion (measurements OT) (elevating leg rests)       Precautions / Restrictions Precautions Precautions: Back Required Braces or Orthoses: Spinal Brace Spinal Brace: Applied in sitting position;Thoracolumbosacral orthotic Restrictions Weight Bearing Restrictions: Yes LUE Weight Bearing: Weight bear through elbow only RLE Weight Bearing: Non weight bearing      Mobility Bed Mobility Overal bed mobility: Needs Assistance Bed Mobility: Supine to Sit     Supine to sit: Min assist (flat bed and no rails)     General bed mobility comments: for RLE and technique  Transfers Overall transfer level: Needs assistance Equipment used: Left platform walker Transfers: Sit to/from Stand Sit to Stand: Min assist         General transfer comment: Cues for proper hand positioning         ADL Overall ADL's : Needs assistance/impaired Eating/Feeding: Modified independent;Sitting   Grooming: Minimal assistance;Sitting   Upper Body Bathing: Minimal  assitance;Sitting   Lower Body Bathing: Minimal assistance (with min A sit<>stand)   Upper Body Dressing : Set up;Sitting   Lower Body Dressing: Moderate assistance (with min A sit<>stand)   Toilet Transfer: Minimal assistance;Ambulation;RW (Bed>down hallway>sit in recliner behind him)   Toileting- Architect and Hygiene: Minimal assistance (with Min A sit<>stand)               Vision Additional Comments: No change from baseline          Pertinent Vitals/Pain Pain Assessment: 0-10 Pain Score: 3  Pain Location: right thigh Pain Descriptors / Indicators: Aching;Sore Pain Intervention(s): Monitored during session;Repositioned     Hand Dominance Right   Extremity/Trunk Assessment Upper Extremity Assessment Upper Extremity Assessment: LUE deficits/detail LUE Deficits / Details: cast proximal to MCPs up to distal to elbow LUE Coordination: decreased fine motor;decreased gross motor   Lower Extremity Assessment Lower Extremity Assessment: Defer to PT evaluation       Communication Communication Communication: No difficulties   Cognition Arousal/Alertness: Awake/alert Behavior During Therapy: WFL for tasks assessed/performed Overall Cognitive Status: Within Functional Limits for tasks assessed                                Home Living Family/patient expects to be discharged to:: Private residence Living Arrangements: Alone Available Help at Discharge: Family;Available PRN/intermittently Type of Home: House Home Access: Level entry     Home Layout: One level     Bathroom Shower/Tub: Walk-in Pensions consultant: Standard     Home Equipment: Hand held shower  head (can get access to a 3n1 or toilet riser)   Additional Comments: says a W/C will fit in his home       Prior Functioning/Environment Level of Independence: Independent             OT Diagnosis: Generalized weakness;Acute pain   OT Problem List:  Decreased range of motion;Pain;Decreased knowledge of use of DME or AE;Impaired balance (sitting and/or standing)   OT Treatment/Interventions: Self-care/ADL training;Patient/family education;Balance training;DME and/or AE instruction    OT Goals(Current goals can be found in the care plan section) Acute Rehab OT Goals OT Goal Formulation: With patient Time For Goal Achievement: 11/12/14 Potential to Achieve Goals: Good  OT Frequency: Min 2X/week           Co-evaluation PT/OT/SLP Co-Evaluation/Treatment: Yes (partial) Reason for Co-Treatment: For patient/therapist safety   OT goals addressed during session: ADL's and self-care;Strengthening/ROM      End of Session Equipment Utilized During Treatment: Gait belt;Rolling walker;Back brace Nurse Communication: Mobility status (NT)  Activity Tolerance: Patient tolerated treatment well Patient left: in chair;with call bell/phone within reach   Time: 1054-1130 OT Time Calculation (min): 36 min Charges:  OT General Charges $OT Visit: 1 Procedure OT Evaluation $Initial OT Evaluation Tier I: 1 Procedure  [name] [name], [name] [name] [phone] 10/29/2014, 11:50 AM

## 2014-10-30 LAB — HEPATITIS PANEL, ACUTE
HCV Ab: NEGATIVE
HEP B C IGM: NONREACTIVE
Hep A IgM: NONREACTIVE
Hepatitis B Surface Ag: NEGATIVE

## 2014-10-30 LAB — PHOSPHORUS: Phosphorus: 3.5 mg/dL (ref 2.5–4.6)

## 2014-10-30 LAB — CBC
HEMATOCRIT: 37.3 % — AB (ref 39.0–52.0)
Hemoglobin: 12.4 g/dL — ABNORMAL LOW (ref 13.0–17.0)
MCH: 30.6 pg (ref 26.0–34.0)
MCHC: 33.2 g/dL (ref 30.0–36.0)
MCV: 92.1 fL (ref 78.0–100.0)
Platelets: 203 10*3/uL (ref 150–400)
RBC: 4.05 MIL/uL — AB (ref 4.22–5.81)
RDW: 13.1 % (ref 11.5–15.5)
WBC: 9.8 10*3/uL (ref 4.0–10.5)

## 2014-10-30 LAB — TSH: TSH: 4.685 u[IU]/mL — AB (ref 0.350–4.500)

## 2014-10-30 LAB — HIV ANTIBODY (ROUTINE TESTING W REFLEX): HIV Screen 4th Generation wRfx: NONREACTIVE

## 2014-10-30 LAB — MAGNESIUM: MAGNESIUM: 1.6 mg/dL — AB (ref 1.7–2.4)

## 2014-10-30 LAB — PREALBUMIN: PREALBUMIN: 20.9 mg/dL (ref 18–38)

## 2014-10-30 MED ORDER — FAMOTIDINE 20 MG PO TABS
20.0000 mg | ORAL_TABLET | Freq: Two times a day (BID) | ORAL | Status: DC
  Administered 2014-10-30 – 2014-11-05 (×11): 20 mg via ORAL
  Filled 2014-10-30 (×11): qty 1

## 2014-10-30 NOTE — Progress Notes (Signed)
2 Days Post-Op  Subjective: No real complaints this AM he says he has heartburn, and only 1 Bm since admit 10/27/14.  Objective: Vital signs in last 24 hours: Temp:  [98.1 F (36.7 C)-98.8 F (37.1 C)] 98.4 F (36.9 C) (05/28 0528) Pulse Rate:  [72-79] 74 (05/28 0528) Resp:  [16] 16 (05/28 0528) BP: (116-137)/(64-82) 116/64 mmHg (05/28 0528) SpO2:  [94 %-97 %] 96 % (05/28 0528) Last BM Date: 10/29/14 480 PO Regular diet Afebrile, VSS Labs OK TSH is up some   Intake/Output from previous day: 05/27 0701 - 05/28 0700 In: 480 [P.O.:480] Out: 450 [Urine:450] Intake/Output this shift:    General appearance: alert, cooperative and no distress Resp: clear to auscultation bilaterally Cardio: regular rate and rhythm, S1, S2 normal, no murmur, click, rub or gallop GI: soft a little distended.  +_BS Extremities: splint and ace on left arm,  fixator on RLE, SCD is off right now.  Toes warm and pink no issues noted.  Lab Results:   Recent Labs  10/29/14 0446 10/30/14 0430  WBC 11.4* 9.8  HGB 11.3* 12.4*  HCT 33.4* 37.3*  PLT 169 203    BMET  Recent Labs  10/28/14 0430 10/29/14 0446  NA 137 135  K 3.6 3.8  CL 104 102  CO2 27 26  GLUCOSE 125* 115*  BUN 13 12  CREATININE 1.03 1.16  CALCIUM 8.5* 8.4*   PT/INR No results for input(s): LABPROT, INR in the last 72 hours.   Recent Labs Lab 10/29/14 0446  AST 21  ALT 14*  ALKPHOS 25*  BILITOT 0.6  PROT 5.1*  ALBUMIN 2.8*     Lipase  No results found for: LIPASE   Studies/Results: Dg Wrist Complete Left  10/28/2014   CLINICAL DATA:  Postop for left wrist fixation.  EXAM: LEFT WRIST - COMPLETE 3+ VIEW  COMPARISON:  Intraoperative study of earlier today and preoperative study of yesterday.  FINDINGS: Overlying splint obscures bony detail. Plate and screw fixation of the distal radius. Ulnar styloid fracture. No hardware complication. Alignment is improved, nearly anatomic.  IMPRESSION: Interval internal fixation  of distal radius fracture.   Electronically Signed   By: Jeronimo Greaves M.D.   On: 10/28/2014 12:42   Dg Knee Right Port  10/28/2014   CLINICAL DATA:  Tibial plateau fracture  EXAM: PORTABLE RIGHT KNEE - 1-2 VIEW  COMPARISON:  CT right knee dated 10/27/2014  FINDINGS: Comminuted bilateral tibial plateau fracture, better evaluated on recent CT.  Comminuted fibular head fracture.  External fixation hardware in the mid femur.  IMPRESSION: Comminuted bilateral tibial plateau fracture, better evaluated on recent CT.  Comminuted fibular head fracture.  External fixation hardware.   Electronically Signed   By: Charline Bills M.D.   On: 10/28/2014 12:48    Medications: . docusate sodium  100 mg Oral BID  . enoxaparin (LOVENOX) injection  40 mg Subcutaneous Q24H  . methocarbamol (ROBAXIN)  IV  1,000 mg Intravenous Q6H  . polyethylene glycol  17 g Oral Daily    Assessment/Plan Fall Left wrist fx s/p ORIF -- per Dr. Carola Frost, NWB but can WBAT through elbow L1 compression fx -- TLSO per Dr. Yetta Barre Right tibia plateau/fibula fx s/p ex fix -- Plan definitive repair Tuesday per Dr. Carola Frost, NWB ABL anemia -- Mild, check tomorrow FEN -- D/C foley VTE -- SCD's, Lovenox Drugs:  + cocaine Endocrine - elevated TSH follow up OP Dispo -- PT/OT    Plan:  For PR on Tuesday  to fix tibial fibula injury.  I will put him on some pepcid, he is already on Miralax.     LOS: 3 days    Lovell Nuttall 10/30/2014

## 2014-10-30 NOTE — Progress Notes (Signed)
Pt seen momentarily to check foot strap on external fixator. It was currently off and pt reported that he had taken it off last night because it was bothering him but had not asked nursing staff to put it back on. I reapplied it and told him it was fine to have it off for short periods (30-60 minutes) but then he needed to put it back on and that he could asking nursing to A him prn. Pt verbalized understanding.  Ignacia PalmaCathy Joselle Deeds, [place] [phone] 10/30/2014

## 2014-10-30 NOTE — Progress Notes (Signed)
     Subjective:  Patient reports pain as mild.  Resting comfortably in bed.  No complaints overnight.  Elevating the RLE.   Objective:   VITALS:   Filed Vitals:   10/29/14 0558 10/29/14 1300 10/29/14 2024 10/30/14 0528  BP: 99/63 137/73 123/82 116/64  Pulse: 72 79 72 74  Temp: 98 F (36.7 C) 98.1 F (36.7 C) 98.8 F (37.1 C) 98.4 F (36.9 C)  TempSrc: Oral     Resp: 16 16 16 16   Height:      Weight:      SpO2: 98% 94% 97% 96%    Neurologically intact ABD soft Neurovascular intact Ext:  Left Upper Extremity  Splint fitting well Brisk cap refill Ext wrm Swelling stable  Right Lower Extremity  Ex fix stable Dressings c/d/i Ext warm + DP pulse  Lab Results  Component Value Date   WBC 9.8 10/30/2014   HGB 12.4* 10/30/2014   HCT 37.3* 10/30/2014   MCV 92.1 10/30/2014   PLT 203 10/30/2014   BMET    Component Value Date/Time   NA 135 10/29/2014 0446   K 3.8 10/29/2014 0446   CL 102 10/29/2014 0446   CO2 26 10/29/2014 0446   GLUCOSE 115* 10/29/2014 0446   BUN 12 10/29/2014 0446   CREATININE 1.16 10/29/2014 0446   CALCIUM 8.4* 10/29/2014 0446   GFRNONAA >60 10/29/2014 0446   GFRAA >60 10/29/2014 0446     Assessment/Plan: 2 Days Post-Op   Active Problems:   Fracture of right tibial plateau   Fracture of head of right fibula   Compression fracture of L1 lumbar vertebra   Closed fracture of left radius   Dislocation of left ulnar styloid   Fall from ladder   Acute blood loss anemia  59 y/o male s/p fall from ladder  1. Fall from Ladder   2. Comminuted bicondylar R tibial plateau fracture with compartment syndrome  S/p ex fix and fasciotomies    Return to OR Tuesday for ORIF  [facility]ressive [facility] toes [facility] Will have foot strap made given DPN symptoms  NWB for now and 8 weeks post op  PT/OT evals    commintued L distal radius fx s/p ORIF NWB thru wrist WBAT thru elbow Finger, elbow, shoulder motion ok Ice and elevate    L1 compression fx  TLSO per NS   3. Pain management: Continue per TS   4. ABL anemia/Hemodynamics Stable   5. Medical issues  Substance abuse based on tox screen- opiates and benzos could be reflective of meds given at hospital but pt also + for cocaine  SW eval   6. DVT/PE prophylaxis: Lovenox for now  7. ID:  Periop abx   8. Metabolic Bone Disease: Vitamin D deficiency  Will supplement Check additional labs  9. Activity: As per #2  10. FEN/Foley/Lines: Diet as tolerated  11. Dispo: Return to OR Tuesday for ORIF R tibial plateau   [name] [name] Marie 10/30/2014, 7:59 AM Cell [phone]

## 2014-10-30 NOTE — Care Management Note (Signed)
Case Management Note  Patient Details  Name: [name] [name] MRN: [id number] Date of Birth: 03-25-1956  Subjective/Objective:                    Action/Plan:   Expected Discharge Date:                  Expected Discharge Plan:  Home w Home Health Services  In-House Referral:     Discharge planning Services  CM Consult  Post Acute Care Choice:    Choice offered to:     DME Arranged:    DME Agency:     HH Arranged:    HH Agency:     Status of Service:  In process, will continue to follow  Medicare Important Message Given:    Date Medicare IM Given:    Medicare IM give by:    Date Additional Medicare IM Given:    Additional Medicare Important Message give by:     If discussed at Long Length of Stay Meetings, dates discussed:    Additional Comments: pt for surgery to repair tibial injury planned for next week on Tuesday. Will continue to f/u to assist with d/c needs.  Isaias Cowmanliveras-Aizpurua, Urban Naval, RN 10/30/2014, 3:27 PM

## 2014-10-31 LAB — CALCIUM, IONIZED: CALCIUM, IONIZED, SERUM: 4.7 mg/dL (ref 4.5–5.6)

## 2014-10-31 MED ORDER — MILK AND MOLASSES ENEMA
1.0000 | Freq: Every day | RECTAL | Status: DC | PRN
  Filled 2014-10-31: qty 250

## 2014-10-31 MED ORDER — MAGNESIUM HYDROXIDE 400 MG/5ML PO SUSP
30.0000 mL | Freq: Every day | ORAL | Status: DC
  Administered 2014-11-05: 30 mL via ORAL
  Filled 2014-10-31 (×4): qty 30

## 2014-10-31 NOTE — Progress Notes (Signed)
[name] [name]  [name] SpurlingStephen Lucey, MD   [name] CabalMaurice Jadarius Commons, PA-C [name] [name] [name] [name], [name] [name], [name] [name]  [phone]                             [phone]   PROGRESS NOTE  Subjective:  negative for Chest Pain  negative for Shortness of Breath  negative for Nausea/Vomiting   negative for Calf Pain  negative for Bowel Movement   Tolerating Diet: yes         Patient reports pain as 4 on 0-10 scale.    Objective: Vital signs in last 24 hours:   Patient Vitals for the past 24 hrs:  BP Temp Temp src Pulse Resp SpO2  10/31/14 0511 130/86 mmHg 98.7 F (37.1 C) - 72 18 99 %  10/30/14 2121 129/85 mmHg 98.8 F (37.1 C) - 71 18 100 %  10/30/14 1409 138/84 mmHg 99.4 F (37.4 C) Oral 74 16 100 %    @flow {1959:LAST@   Intake/Output from previous day:   05/28 0701 - 05/29 0700 In: 180 [P.O.:180] Out: 1450 [Urine:1450]   Intake/Output this shift:       Intake/Output      05/28 0701 - 05/29 0700 05/29 0701 - 05/30 0700   P.O. 180    Total Intake(mL/kg) 180 (2.5)    Urine (mL/kg/hr) 1450 (0.8)    Emesis/NG output 0 (0)    Stool 0 (0)    Total Output 1450     Net -1270          Urine Occurrence 2 x    Stool Occurrence 1 x    Emesis Occurrence 1 x       LABORATORY DATA:  Recent Labs  10/27/14 1321 10/28/14 0430 10/29/14 0446 10/30/14 0430  WBC 7.0 10.7* 11.4* 9.8  HGB 14.7 13.4 11.3* 12.4*  HCT 43.2 39.9 33.4* 37.3*  PLT 261 208 169 203    Recent Labs  10/27/14 1321 10/28/14 0430 10/29/14 0446  NA 140 137 135  K 4.1 3.6 3.8  CL 106 104 102  CO2 25 27 26   BUN 11 13 12   CREATININE 1.34* 1.03 1.16  GLUCOSE 109* 125* 115*  CALCIUM 9.4 8.5* 8.4*   No results found for: INR, PROTIME  Examination:  General appearance: alert, cooperative and no distress Extremities: extremities normal, atraumatic, no cyanosis or edema and Homans sign is negative, no sign of DVT  Wound Exam: clean, dry, intact   Drainage:  None: wound tissue dry  Motor  Exam: EHL and FHL Intact  Sensory Exam: Deep Peroneal normal   Assessment:    3 Days Post-Op  Procedure(s) (LRB): EXTERNAL FIXATION LEG (Right) OPEN REDUCTION INTERNAL FIXATION (ORIF) DISTAL RADIAL FRACTURE (Left) FASCIOTOMY (Right)  ADDITIONAL DIAGNOSIS:  Active Problems:   Fracture of right tibial plateau   Fracture of head of right fibula   Compression fracture of L1 lumbar vertebra   Closed fracture of left radius   Dislocation of left ulnar styloid   Fall from ladder   Acute blood loss anemia     Plan: Physical Therapy as ordered Non Weight Bearing (NWB) r leg l wrist  Surgery tuesday         [name] [name] 10/31/2014, 10:36 AM

## 2014-10-31 NOTE — Progress Notes (Signed)
In to check pt's foot drop strap on external fixator. Pt had it on this AM; however it had slid down to arch. I re-adjusted it and make pt aware of where it needed to be and to call for help to adjust it if it slid down. Also spoke with nursing about it and put in general sticky note. [name] [name], [place] [phone] 10/31/2014

## 2014-10-31 NOTE — Progress Notes (Signed)
Patient ID: Breck CoonsMichael E Mastel, male   DOB: [birth year], 57 y.o.   MRN: [id number] 3 Days Post-Op  Subjective: Had a lot of bloating yesterday, has improved some, passed some gas  Objective: Vital signs in last 24 hours: Temp:  [98.7 F (37.1 C)-99.4 F (37.4 C)] 98.7 F (37.1 C) (05/29 0511) Pulse Rate:  [71-74] 72 (05/29 0511) Resp:  [16-18] 18 (05/29 0511) BP: (129-138)/(84-86) 130/86 mmHg (05/29 0511) SpO2:  [99 %-100 %] 99 % (05/29 0511) Last BM Date: 10/29/14  Intake/Output from previous day: 05/28 0701 - 05/29 0700 In: 180 [P.O.:180] Out: 1450 [Urine:1450] Intake/Output this shift:    General appearance: alert and cooperative Resp: clear to auscultation bilaterally Cardio: regular rate and rhythm GI: soft, NT, +BS Extremities: ex fix R leg, toes warm  Lab Results: CBC   Recent Labs  10/29/14 0446 10/30/14 0430  WBC 11.4* 9.8  HGB 11.3* 12.4*  HCT 33.4* 37.3*  PLT 169 203   BMET  Recent Labs  10/29/14 0446  NA 135  K 3.8  CL 102  CO2 26  GLUCOSE 115*  BUN 12  CREATININE 1.16  CALCIUM 8.4*   PT/INR No results for input(s): LABPROT, INR in the last 72 hours. ABG No results for input(s): PHART, HCO3 in the last 72 hours.  Invalid input(s): PCO2, PO2  Studies/Results: No results found.  Anti-infectives: Anti-infectives    Start     Dose/Rate Route Frequency Ordered Stop   10/28/14 1600  ceFAZolin (ANCEF) IVPB 2 g/50 mL premix     2 g 100 mL/hr over 30 Minutes Intravenous 3 times per day 10/28/14 1154 10/29/14 0624      Assessment/Plan: s/p Procedure(s): EXTERNAL FIXATION LEG OPEN REDUCTION INTERNAL FIXATION (ORIF) DISTAL RADIAL FRACTURE FASCIOTOMY Fall Left wrist fx s/p ORIF -- per Dr. Carola FrostHandy, NWB but can WBAT through elbow L1 compression fx -- TLSO per Dr. Yetta BarreJones Right tibia plateau/fibula fx s/p ex fix -- Plan definitive repair Tuesday per Dr. Carola FrostHandy, NWB ABL anemia -- improved FEN -- D/C foley, add MOM and enema PRN VTE -- SCD's,  Lovenox Substance abuse -  + cocaine Endocrine - elevated TSH follow up OP Dispo -- PT/OT  LOS: 4 days    Violeta GelinasBurke Shabria Egley, MD, MPH, FACS Trauma: [phone] General Surgery: [phone]  10/31/2014

## 2014-11-01 LAB — TESTOSTERONE: Testosterone: 1295 ng/dL — ABNORMAL HIGH (ref 348–1197)

## 2014-11-01 LAB — PTH, INTACT AND CALCIUM
Calcium, Total (PTH): 8.4 mg/dL — ABNORMAL LOW (ref 8.7–10.2)
PTH: 26 pg/mL (ref 15–65)

## 2014-11-01 LAB — SEX HORMONE BINDING GLOBULIN: Sex Hormone Binding: 60.6 nmol/L (ref 19.3–76.4)

## 2014-11-01 MED ORDER — CEFAZOLIN SODIUM-DEXTROSE 2-3 GM-% IV SOLR
2.0000 g | INTRAVENOUS | Status: AC
  Administered 2014-11-02: 2 g via INTRAVENOUS
  Filled 2014-11-01: qty 50

## 2014-11-01 NOTE — Progress Notes (Signed)
4 Days Post-Op  Subjective: No complaints  Objective: Vital signs in last 24 hours: Temp:  [98.5 F (36.9 C)-98.6 F (37 C)] 98.5 F (36.9 C) (05/30 0604) Pulse Rate:  [62-80] 62 (05/30 0604) Resp:  [16] 16 (05/30 0604) BP: (110-122)/(72-80) 110/72 mmHg (05/30 0604) SpO2:  [99 %-100 %] 99 % (05/30 0604) Last BM Date: 11/01/14  Intake/Output from previous day: 05/29 0701 - 05/30 0700 In: 720 [P.O.:720] Out: 725 [Urine:725] Intake/Output this shift:    Resp: clear to auscultation bilaterally Cardio: regular rate and rhythm GI: soft, non-tender; bowel sounds normal; no masses,  no organomegaly  Lab Results:   Recent Labs  10/30/14 0430  WBC 9.8  HGB 12.4*  HCT 37.3*  PLT 203   BMET  Recent Labs  10/30/14 0430  CALCIUM 8.4*   PT/INR No results for input(s): LABPROT, INR in the last 72 hours. ABG No results for input(s): PHART, HCO3 in the last 72 hours.  Invalid input(s): PCO2, PO2  Studies/Results: No results found.  Anti-infectives: Anti-infectives    Start     Dose/Rate Route Frequency Ordered Stop   11/02/14 0800  ceFAZolin (ANCEF) IVPB 2 g/50 mL premix     2 g 100 mL/hr over 30 Minutes Intravenous  Once 11/01/14 1013     10/28/14 1600  ceFAZolin (ANCEF) IVPB 2 g/50 mL premix     2 g 100 mL/hr over 30 Minutes Intravenous 3 times per day 10/28/14 1154 10/29/14 0624      Assessment/Plan: s/p Procedure(s): EXTERNAL FIXATION LEG (Right) OPEN REDUCTION INTERNAL FIXATION (ORIF) DISTAL RADIAL FRACTURE (Left) FASCIOTOMY (Right)    [name] Gelinas, MD Physician Signed Trauma Progress Notes 10/31/2014 10:12 AM    Expand All Collapse All   Patient ID: [name] [name], male DOB: September 24, 1955, 59 y.o. MRN: [id number] 3 Days Post-Op  Subjective: Had a lot of bloating yesterday, has improved some, passed some gas  Objective: Vital signs in last 24 hours: Temp: [98.7 F (37.1 C)-99.4 F (37.4 C)] 98.7 F (37.1 C) (05/29 0511) Pulse Rate: [71-74]  72 (05/29 0511) Resp: [16-18] 18 (05/29 0511) BP: (129-138)/(84-86) 130/86 mmHg (05/29 0511) SpO2: [99 %-100 %] 99 % (05/29 0511) Last BM Date: 10/29/14  Intake/Output from previous day: 05/28 0701 - 05/29 0700 In: 180 [P.O.:180] Out: 1450 [Urine:1450] Intake/Output this shift:    General appearance: alert and cooperative Resp: clear to auscultation bilaterally Cardio: regular rate and rhythm GI: soft, NT, +BS Extremities: ex fix R leg, toes warm  Lab Results: CBC   Recent Labs (last 2 labs)      Recent Labs  10/29/14 0446 10/30/14 0430  WBC 11.4* 9.8  HGB 11.3* 12.4*  HCT 33.4* 37.3*  PLT 169 203     BMET  Recent Labs (last 2 labs)      Recent Labs  10/29/14 0446  NA 135  K 3.8  CL 102  CO2 26  GLUCOSE 115*  BUN 12  CREATININE 1.16  CALCIUM 8.4*     PT/INR  Recent Labs (last 2 labs)     No results for input(s): LABPROT, INR in the last 72 hours.   ABG  Recent Labs (last 2 labs)     No results for input(s): PHART, HCO3 in the last 72 hours.  Invalid input(s): PCO2, PO2    Studies/Results:  Imaging Results (Last 48 hours)    No results found.    Anti-infectives: Anti-infectives    Start   Dose/Rate Route Frequency Ordered Stop  10/28/14 1600  ceFAZolin (ANCEF) IVPB 2 g/50 mL premix    2 g 100 mL/hr over 30 Minutes Intravenous 3 times per day 10/28/14 1154 10/29/14 0624      Assessment/Plan: s/p Procedure(s): EXTERNAL FIXATION LEG OPEN REDUCTION INTERNAL FIXATION (ORIF) DISTAL RADIAL FRACTURE FASCIOTOMY Fall Left wrist fx s/p ORIF -- per Dr. Carola FrostHandy, NWB but can WBAT through elbow L1 compression fx -- TLSO per Dr. Yetta BarreJones Right tibia plateau/fibula fx s/p ex fix -- Plan definitive repair Tuesday per Dr. Carola FrostHandy, NWB ABL anemia -- improved FEN -- D/C foley, add MOM and enema PRN VTE -- SCD's, Lovenox Substance abuse - + cocaine Endocrine - elevated TSH follow up OP Dispo --  PT/OT                LOS: 5 days    TOTH III,[name] 11/01/2014

## 2014-11-01 NOTE — Clinical Social Work Note (Signed)
Clinical Social Work Assessment  Patient Details  Name: [name] [name] MRN: [id number] Date of Birth: Jun 25, 1955  Date of referral:  11/01/14               Reason for consult:  Substance Use/ETOH Abuse, Financial Concerns, [employer]                Permission sought to share information with:  Case Production designer, theatre/television/film, Family Supports Permission granted to share information::  Yes, Verbal Permission Granted  Name::     Daughter and ex-wife involved  Agency::  [facility] for access to care PCP  Relationship::     Contact Information:     Housing/Transportation Living arrangements for the past 2 months:  Single Family Home Source of Information:  Patient, Medical Team Patient Interpreter Needed:  None Criminal Activity/Legal Involvement Pertinent to Current Situation/Hospitalization:  No - Comment as needed Significant Relationships:  Adult Children, Other Family Members, Friend Lives with:  Self Do you feel safe going back to the place where you live?  Yes Need for family participation in patient care:  No (Coment)  Care giving concerns:  None at this time. Patient is to have surgery on Tuesday and will be assessed by team for needs. Reports he lives independently in a home that his family owns and they all share property. Reports in the summer months he moves to [place] for work and to stay with other family.  Prior to injury patient was independent with all ADLs.   Social Worker assessment / plan:  LCSW covering for Trauma team received consult for SA and SBIRT/full assessment. 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  Explained fall and trauma, but with an optimistic outlook on situation and hopeful.  Patient explained role and reason for consult: Substance Abuse and testing positive for cocaine, opiates, and benzos.  Patient did admit to using  cocaine socially with friends. Reports using once a month or less and feels at this time it is not a problem or barrier to his recovery. Patient was caught off guard and denied opiates and benzos. Reports he does not like pain medication or taking pills so this must be an error. He is not hostile or defensive during assessment, just more surprised. LCSW and patient discussed cocaine being mixed with other substances and the need to be aware of what you are putting into your body and the outcomes.  Patient agreeable and accepting of the substance abuse counseling and education.  SBIRT completed.  Patient give more history of self. Reports he lives alone, has one daughter who is 79 years old and an ex-wife whom he is awaiting for the divorce to be finalized. Reports they are all very close and daughter and ex-wife have been visiting patient while in hospital. Reports strong support in community with group of friends. Patient report his time is occupied with helping his father and keeping busy with different jobs to help support with income. Patient is not employed at this time.  No insurance. LCSW also discussed [facility] and orange card. LCSW was given permission to print face sheet and give to representative who can help establish access to care and outpatient follow up.    Employment status:  Journalist, newspaper information:  Self Pay (Medicaid Pending) PT Recommendations:  Home with Home Health Information / Referral to community resources:  Other (  Comment Required), SBIRT ([facility] card referral)  Patient/Family's Response to care:  Continues to be agreeable with treatment plan. Scheduled surgery for Tuesday.  Patient/Family's Understanding of and Emotional Response to Diagnosis, Current Treatment, and Prognosis:  Patient very welcoming and agreeable to assessment. Aware of concerns with substance abuse and ceasing use.  Patient understands plan, needs, and treatment at this time. NO  barriers.  Emotional Assessment Appearance:  Appears older than stated age Attitude/Demeanor/Rapport:  Other (Cooperative, pleasant) Affect (typically observed):  Accepting, Adaptable, Pleasant Orientation:  Oriented to Self, Oriented to Place, Oriented to  Time, Oriented to Situation Alcohol / Substance use:  Illicit Drugs (positive for cocaine, opiates, benzos) Psych involvement (Current and /or in the community):  No (Comment)  Discharge Needs  Concerns to be addressed:  Adjustment to Illness Readmission within the last 30 days:  No Current discharge risk:  Physical Impairment Barriers to Discharge:  Continued Medical Work up    No other Academic librarianreal social worker needs at this time. Will sign off and if needs arise, please re-consult. Patient denies needs for SA resources. Patient agreeable for follow up with [facility] and LCSW will complete this referral.   Raye SorrowCoble, [name], LCSW 11/01/2014, 2:28 PM

## 2014-11-01 NOTE — Progress Notes (Signed)
Orthopaedic Trauma Service Progress Note  Subjective  Doing well, no complaints Ready for OR tomorrow  ROS Improving sensation along dorsum or R foot  Objective   BP 110/72 mmHg  Pulse 62  Temp(Src) 98.5 F (36.9 C) (Oral)  Resp 16  Ht  (1.778 m)  Wt 71.9 kg (158 lb 8.2 oz)  BMI 22.74 kg/m2  SpO2 99%  Intake/Output      05/29 0701 - 05/30 0700 05/30 0701 - 05/31 0700   P.O. 720    Total Intake(mL/kg) 720 (10)    Urine (mL/kg/hr) 725 (0.4)    Emesis/NG output     Stool 0 (0)    Total Output 725     Net -5          Urine Occurrence 2 x    Stool Occurrence 4 x      Labs  Results for NENG, ALBEE (MRN [id number]) as of 11/01/2014 10:06  Ref. Range 10/28/2014 14:14  Vit D, 25-Hydroxy Latest Ref Range: 30.0-100.0 ng/mL 12.4 (L)   Results for ROCHELLE, NEPHEW (MRN [id number]) as of 11/01/2014 10:06  Ref. Range 10/30/2014 04:30  TSH Latest Ref Range: 0.350-4.500 uIU/mL 4.685 (H)  Hep A IgM Latest Ref Range: NON REACTIVE  NON REACTIVE  Hepatitis B Surface Ag Latest Ref Range: NEGATIVE  NEGATIVE  Hep B C IgM Latest Ref Range: NON REACTIVE  NON REACTIVE  HCV Ab Latest Ref Range: NEGATIVE  NEGATIVE  HIV Screen 4th Generation wRfx Latest Ref Range: Non Reactive  Non Reactive   Exam  Gen: resting comfortably in bed, NAD Abd: +BS, NTND Ext:                               Left Upper Extremity                           Splint fitting well                         Radial, ulnar, median nv motor and sensory functions intact                         Brisk cap refill                         Ext wrm                         Swelling stable                                      Right Lower Extremity                           Ex fix stable                         Dressings c/d/i   Foot strap in place                          Ext warm                         +  DP pulse                         Dec DPN sensation, but improving                            SPN and TN  sensation intact                         Good EHL function, ok ankle extension                           Lesser toe motor function intact                         AT/PT/Peroneals/gastroc motor functions intact                           Assessment and Plan   POD/HD#: 814   59 y/o male s/p fall from ladder  1. Fall from Ladder   2. Comminuted bicondylar R tibial plateau fracture with compartment syndrome               S/p ex fix and fasciotomies                            Return to OR Tomorrow for ORIF               Aggressive ice and elevation             Ok to move toes and ankle               NWB for now and 8 weeks post op               PT/OT        commintued L distal radius fx s/p ORIF             NWB thru wrist             WBAT thru elbow             Finger, elbow, shoulder motion ok             Ice and elevate        L1 compression fx               TLSO per NS   3. Pain management:             Continue per TS   4. ABL anemia/Hemodynamics             Stable   Cbc in am   5. Medical issues               Substance abuse based on tox screen- opiates and benzos could be reflective of meds given at hospital but pt also + for cocaine               SW eval   6. DVT/PE prophylaxis:             Lovenox for now- hold for OR               7. ID:               Periop abx   8. Metabolic  Bone Disease:             Vitamin D deficiency                           Will supplement                         Check additional labs- pending   9. Activity:             As per #2  10. FEN/Foley/Lines:             Diet as tolerated  Npo after MN   11. Dispo:             Return to OR Tomorrow for ORIF R tibial plateau     Mearl Latin, PA-C [facility] [phone] (O) 11/01/2014 10:05 AM

## 2014-11-02 ENCOUNTER — Encounter (HOSPITAL_COMMUNITY): Admission: EM | Disposition: A | Discharge status: Home / Self Care | Payer: Self-pay | Source: Home / Self Care

## 2014-11-02 ENCOUNTER — Inpatient Hospital Stay (HOSPITAL_COMMUNITY): Payer: MEDICAID | Admitting: Anesthesiology

## 2014-11-02 ENCOUNTER — Inpatient Hospital Stay (HOSPITAL_COMMUNITY): Payer: Self-pay | Admitting: Anesthesiology

## 2014-11-02 ENCOUNTER — Inpatient Hospital Stay (HOSPITAL_COMMUNITY): Payer: Self-pay

## 2014-11-02 HISTORY — PX: ORIF TIBIA PLATEAU: SHX2132

## 2014-11-02 LAB — CBC
HCT: 38.6 % — ABNORMAL LOW (ref 39.0–52.0)
Hemoglobin: 13.2 g/dL (ref 13.0–17.0)
MCH: 31 pg (ref 26.0–34.0)
MCHC: 34.2 g/dL (ref 30.0–36.0)
MCV: 90.6 fL (ref 78.0–100.0)
Platelets: 279 10*3/uL (ref 150–400)
RBC: 4.26 MIL/uL (ref 4.22–5.81)
RDW: 12.9 % (ref 11.5–15.5)
WBC: 11.4 10*3/uL — AB (ref 4.0–10.5)

## 2014-11-02 LAB — HEMOGLOBIN A1C
Hgb A1c MFr Bld: 5.6 % (ref 4.8–5.6)
Mean Plasma Glucose: 114 mg/dL

## 2014-11-02 LAB — BASIC METABOLIC PANEL
ANION GAP: 8 (ref 5–15)
BUN: 15 mg/dL (ref 6–20)
CHLORIDE: 101 mmol/L (ref 101–111)
CO2: 27 mmol/L (ref 22–32)
Calcium: 9.1 mg/dL (ref 8.9–10.3)
Creatinine, Ser: 1.03 mg/dL (ref 0.61–1.24)
GFR calc Af Amer: >60 mL/min (ref >60)
GFR calc non Af Amer: >60 mL/min (ref >60)
Glucose, Bld: 97 mg/dL (ref 65–99)
Potassium: 3.9 mmol/L (ref 3.5–5.1)
Sodium: 136 mmol/L (ref 135–145)

## 2014-11-02 LAB — TESTOSTERONE, FREE: TESTOSTERONE FREE: 3.6 pg/mL — AB (ref 7.2–24.0)

## 2014-11-02 SURGERY — OPEN REDUCTION INTERNAL FIXATION (ORIF) TIBIAL PLATEAU
Anesthesia: General | Site: Leg Lower | Laterality: Right

## 2014-11-02 MED ORDER — SUCCINYLCHOLINE CHLORIDE 20 MG/ML IJ SOLN
INTRAMUSCULAR | Status: DC | PRN
  Administered 2014-11-02: 100 mg via INTRAVENOUS

## 2014-11-02 MED ORDER — PHENYLEPHRINE HCL 10 MG/ML IJ SOLN
INTRAMUSCULAR | Status: DC | PRN
  Administered 2014-11-02: 80 ug via INTRAVENOUS

## 2014-11-02 MED ORDER — FENTANYL CITRATE (PF) 250 MCG/5ML IJ SOLN
INTRAMUSCULAR | Status: AC
  Filled 2014-11-02: qty 5

## 2014-11-02 MED ORDER — PROPOFOL 10 MG/ML IV BOLUS
INTRAVENOUS | Status: DC | PRN
  Administered 2014-11-02: 160 mg via INTRAVENOUS

## 2014-11-02 MED ORDER — STERILE WATER FOR INJECTION IJ SOLN
INTRAMUSCULAR | Status: AC
  Filled 2014-11-02: qty 10

## 2014-11-02 MED ORDER — HYDROMORPHONE HCL 1 MG/ML IJ SOLN
INTRAMUSCULAR | Status: AC
  Filled 2014-11-02: qty 1

## 2014-11-02 MED ORDER — GLYCOPYRROLATE 0.2 MG/ML IJ SOLN
INTRAMUSCULAR | Status: DC | PRN
  Administered 2014-11-02: .7 mg via INTRAVENOUS

## 2014-11-02 MED ORDER — 0.9 % SODIUM CHLORIDE (POUR BTL) OPTIME
TOPICAL | Status: DC | PRN
  Administered 2014-11-02: 1000 mL

## 2014-11-02 MED ORDER — SUCCINYLCHOLINE CHLORIDE 20 MG/ML IJ SOLN
INTRAMUSCULAR | Status: AC
  Filled 2014-11-02: qty 1

## 2014-11-02 MED ORDER — PROMETHAZINE HCL 25 MG/ML IJ SOLN
6.2500 mg | INTRAMUSCULAR | Status: DC | PRN
Start: 2014-11-02 — End: 2014-11-02

## 2014-11-02 MED ORDER — GLYCOPYRROLATE 0.2 MG/ML IJ SOLN
INTRAMUSCULAR | Status: AC
  Filled 2014-11-02: qty 3

## 2014-11-02 MED ORDER — HYDROMORPHONE HCL 1 MG/ML IJ SOLN
0.5000 mg | INTRAMUSCULAR | Status: DC | PRN
  Administered 2014-11-02 – 2014-11-03 (×6): 0.5 mg via INTRAVENOUS
  Filled 2014-11-02 (×6): qty 1

## 2014-11-02 MED ORDER — PHENYLEPHRINE 40 MCG/ML (10ML) SYRINGE FOR IV PUSH (FOR BLOOD PRESSURE SUPPORT)
PREFILLED_SYRINGE | INTRAVENOUS | Status: AC
  Filled 2014-11-02: qty 10

## 2014-11-02 MED ORDER — NEOSTIGMINE METHYLSULFATE 10 MG/10ML IV SOLN
INTRAVENOUS | Status: DC | PRN
  Administered 2014-11-02: 4 mg via INTRAVENOUS

## 2014-11-02 MED ORDER — MIDAZOLAM HCL 2 MG/2ML IJ SOLN
INTRAMUSCULAR | Status: AC
  Filled 2014-11-02: qty 2

## 2014-11-02 MED ORDER — LACTATED RINGERS IV SOLN
INTRAVENOUS | Status: DC | PRN
  Administered 2014-11-02 (×2): via INTRAVENOUS

## 2014-11-02 MED ORDER — NEOSTIGMINE METHYLSULFATE 10 MG/10ML IV SOLN
INTRAVENOUS | Status: AC
  Filled 2014-11-02: qty 1

## 2014-11-02 MED ORDER — ROCURONIUM BROMIDE 100 MG/10ML IV SOLN
INTRAVENOUS | Status: DC | PRN
  Administered 2014-11-02: 20 mg via INTRAVENOUS
  Administered 2014-11-02: 30 mg via INTRAVENOUS

## 2014-11-02 MED ORDER — LIDOCAINE HCL (CARDIAC) 20 MG/ML IV SOLN
INTRAVENOUS | Status: AC
  Filled 2014-11-02: qty 5

## 2014-11-02 MED ORDER — MIDAZOLAM HCL 5 MG/5ML IJ SOLN
INTRAMUSCULAR | Status: DC | PRN
  Administered 2014-11-02: 2 mg via INTRAVENOUS

## 2014-11-02 MED ORDER — FENTANYL CITRATE (PF) 100 MCG/2ML IJ SOLN
INTRAMUSCULAR | Status: DC | PRN
  Administered 2014-11-02: 150 ug via INTRAVENOUS
  Administered 2014-11-02: 100 ug via INTRAVENOUS

## 2014-11-02 MED ORDER — HYDROMORPHONE HCL 1 MG/ML IJ SOLN
0.2500 mg | INTRAMUSCULAR | Status: DC | PRN
  Administered 2014-11-02 (×2): 0.5 mg via INTRAVENOUS

## 2014-11-02 MED ORDER — CEFAZOLIN SODIUM 1-5 GM-% IV SOLN
1.0000 g | Freq: Three times a day (TID) | INTRAVENOUS | Status: AC
  Administered 2014-11-02 – 2014-11-03 (×3): 1 g via INTRAVENOUS
  Filled 2014-11-02 (×3): qty 50

## 2014-11-02 SURGICAL SUPPLY — 93 items
BANDAGE ELASTIC 4 VELCRO ST LF (GAUZE/BANDAGES/DRESSINGS) ×3 IMPLANT
BANDAGE ELASTIC 6 VELCRO ST LF (GAUZE/BANDAGES/DRESSINGS) ×3 IMPLANT
BANDAGE ESMARK 6X9 LF (GAUZE/BANDAGES/DRESSINGS) ×1 IMPLANT
BIT DRILL 100X2.5XANTM LCK (BIT) IMPLANT
BIT DRILL CAL (BIT) IMPLANT
BIT DRL 100X2.5XANTM LCK (BIT) ×1
BLADE SURG 10 STRL SS (BLADE) ×3 IMPLANT
BLADE SURG 15 STRL LF DISP TIS (BLADE) ×1 IMPLANT
BLADE SURG 15 STRL SS (BLADE) ×3
BLADE SURG ROTATE 9660 (MISCELLANEOUS) IMPLANT
BNDG CMPR 9X6 STRL LF SNTH (GAUZE/BANDAGES/DRESSINGS) ×1
BNDG COHESIVE 4X5 TAN STRL (GAUZE/BANDAGES/DRESSINGS) ×3 IMPLANT
BNDG ESMARK 6X9 LF (GAUZE/BANDAGES/DRESSINGS) ×3
BNDG GAUZE ELAST 4 BULKY (GAUZE/BANDAGES/DRESSINGS) ×3 IMPLANT
BRUSH SCRUB DISP (MISCELLANEOUS) ×6 IMPLANT
CANISTER SUCT 3000ML PPV (MISCELLANEOUS) ×3 IMPLANT
COVER MAYO STAND STRL (DRAPES) ×3 IMPLANT
COVER SURGICAL LIGHT HANDLE (MISCELLANEOUS) ×3 IMPLANT
DRAPE C-ARM 42X72 X-RAY (DRAPES) ×3 IMPLANT
DRAPE C-ARMOR (DRAPES) ×3 IMPLANT
DRAPE INCISE IOBAN 66X45 STRL (DRAPES) ×3 IMPLANT
DRAPE ORTHO SPLIT 77X108 STRL (DRAPES)
DRAPE SURG ORHT 6 SPLT 77X108 (DRAPES) IMPLANT
DRAPE U-SHAPE 47X51 STRL (DRAPES) ×3 IMPLANT
DRILL BIT 2.5MM (BIT) ×3
DRILL BIT CAL (BIT) ×3
DRSG ADAPTIC 3X8 NADH LF (GAUZE/BANDAGES/DRESSINGS) ×5 IMPLANT
DRSG PAD ABDOMINAL 8X10 ST (GAUZE/BANDAGES/DRESSINGS) ×12 IMPLANT
ELECT REM PT RETURN 9FT ADLT (ELECTROSURGICAL) ×3
ELECTRODE REM PT RTRN 9FT ADLT (ELECTROSURGICAL) ×1 IMPLANT
EVACUATOR 1/8 PVC DRAIN (DRAIN) IMPLANT
EVACUATOR 3/16  PVC DRAIN (DRAIN)
EVACUATOR 3/16 PVC DRAIN (DRAIN) IMPLANT
GAUZE SPONGE 4X4 12PLY STRL (GAUZE/BANDAGES/DRESSINGS) ×3 IMPLANT
GLOVE BIO SURGEON STRL SZ7.5 (GLOVE) ×3 IMPLANT
GLOVE BIO SURGEON STRL SZ8 (GLOVE) ×3 IMPLANT
GLOVE BIOGEL PI IND STRL 7.5 (GLOVE) ×1 IMPLANT
GLOVE BIOGEL PI IND STRL 8 (GLOVE) ×1 IMPLANT
GLOVE BIOGEL PI INDICATOR 7.5 (GLOVE) ×2
GLOVE BIOGEL PI INDICATOR 8 (GLOVE) ×2
GOWN STRL REUS W/ TWL LRG LVL3 (GOWN DISPOSABLE) ×2 IMPLANT
GOWN STRL REUS W/ TWL XL LVL3 (GOWN DISPOSABLE) ×1 IMPLANT
GOWN STRL REUS W/TWL LRG LVL3 (GOWN DISPOSABLE) ×6
GOWN STRL REUS W/TWL XL LVL3 (GOWN DISPOSABLE) ×3
IMMOBILIZER KNEE 22 (SOFTGOODS) ×2 IMPLANT
IMMOBILIZER KNEE 22 UNIV (SOFTGOODS) ×3 IMPLANT
K-WIRE ACE 1.6X6 (WIRE) ×6
KIT BASIN OR (CUSTOM PROCEDURE TRAY) ×3 IMPLANT
KIT ROOM TURNOVER OR (KITS) ×3 IMPLANT
KWIRE ACE 1.6X6 (WIRE) IMPLANT
NDL SUT 6 .5 CRC .975X.05 MAYO (NEEDLE) IMPLANT
NEEDLE 22X1 1/2 (OR ONLY) (NEEDLE) IMPLANT
NEEDLE MAYO TAPER (NEEDLE)
NS IRRIG 1000ML POUR BTL (IV SOLUTION) ×3 IMPLANT
PACK ORTHO EXTREMITY (CUSTOM PROCEDURE TRAY) ×3 IMPLANT
PAD ABD 8X10 STRL (GAUZE/BANDAGES/DRESSINGS) ×2 IMPLANT
PAD ARMBOARD 7.5X6 YLW CONV (MISCELLANEOUS) ×6 IMPLANT
PAD CAST 4YDX4 CTTN HI CHSV (CAST SUPPLIES) ×1 IMPLANT
PADDING CAST COTTON 4X4 STRL (CAST SUPPLIES) ×3
PADDING CAST COTTON 6X4 STRL (CAST SUPPLIES) ×3 IMPLANT
PLATE LOCK 7H STD RT PROX TIB (Plate) ×2 IMPLANT
SCREW CORT 3.5X26 815037026 (Screw) ×2 IMPLANT
SCREW LOCK CORT STAR 3.5X24 (Screw) ×2 IMPLANT
SCREW LOCK CORT STAR 3.5X26 (Screw) ×2 IMPLANT
SCREW LOCK CORT STAR 3.5X28 (Screw) ×2 IMPLANT
SCREW LOCK CORT STAR 3.5X30 (Screw) ×4 IMPLANT
SCREW LOCK CORT STAR 3.5X60 (Screw) ×2 IMPLANT
SCREW LOCK CORT STAR 3.5X65 (Screw) ×6 IMPLANT
SCREW LOCK CORT STAR 3.5X70 (Screw) ×2 IMPLANT
SCREW LOCK CORT STAR 3.5X75 (Screw) ×4 IMPLANT
SCREW LOCK CORT STAR 3.5X80 (Screw) ×2 IMPLANT
SCREW LP 3.5X75MM (Screw) ×4 IMPLANT
SPONGE GAUZE 4X4 12PLY STER LF (GAUZE/BANDAGES/DRESSINGS) ×2 IMPLANT
SPONGE LAP 18X18 X RAY DECT (DISPOSABLE) ×3 IMPLANT
STAPLER VISISTAT 35W (STAPLE) ×3 IMPLANT
STOCKINETTE IMPERVIOUS LG (DRAPES) ×3 IMPLANT
SUCTION FRAZIER TIP 10 FR DISP (SUCTIONS) ×3 IMPLANT
SUT ETHILON 3 0 PS 1 (SUTURE) IMPLANT
SUT PROLENE 0 CT 2 (SUTURE) ×6 IMPLANT
SUT VIC AB 0 CT1 27 (SUTURE) ×3
SUT VIC AB 0 CT1 27XBRD ANBCTR (SUTURE) ×1 IMPLANT
SUT VIC AB 1 CT1 27 (SUTURE) ×3
SUT VIC AB 1 CT1 27XBRD ANBCTR (SUTURE) ×1 IMPLANT
SUT VIC AB 2-0 CT1 27 (SUTURE) ×6
SUT VIC AB 2-0 CT1 TAPERPNT 27 (SUTURE) ×2 IMPLANT
SYR 20ML ECCENTRIC (SYRINGE) IMPLANT
TOWEL OR 17X24 6PK STRL BLUE (TOWEL DISPOSABLE) ×3 IMPLANT
TOWEL OR 17X26 10 PK STRL BLUE (TOWEL DISPOSABLE) ×6 IMPLANT
TRAY FOLEY CATH 16FRSI W/METER (SET/KITS/TRAYS/PACK) IMPLANT
TUBE CONNECTING 12'X1/4 (SUCTIONS) ×1
TUBE CONNECTING 12X1/4 (SUCTIONS) ×2 IMPLANT
WATER STERILE IRR 1000ML POUR (IV SOLUTION) ×6 IMPLANT
YANKAUER SUCT BULB TIP NO VENT (SUCTIONS) ×3 IMPLANT

## 2014-11-02 NOTE — Progress Notes (Signed)
Orthopedic Tech Progress Note Patient Details:  Breck CoonsMichael E Flanery May 18, 1956 [id number] Advanced called for brace order Patient ID: Breck CoonsMichael E Reif, male   DOB: May 18, 1956, 59 y.o.   MRN: [id number]   Orie Routsia R Thompson 11/02/2014, 12:25 PM

## 2014-11-02 NOTE — Care Management Note (Signed)
Case Management Note  Patient Details  Name: [name] [name] MRN: [id number] Date of Birth: 1955-09-23  Subjective/Objective:       Pt back to OR today for ORIF of tibial plateau fracture.  PT/OT recommending no OP follow up at dc.               Action/Plan: Pt will have equipment needs at dc.  Will follow for DME needs as pt progresses.    Expected Discharge Date:                  Expected Discharge Plan:     In-House Referral:     Discharge planning Services  CM Consult  Post Acute Care Choice:    Choice offered to:     DME Arranged:    DME Agency:     HH Arranged:    HH Agency:     Status of Service:  In process, will continue to follow  Medicare Important Message Given:  No Date Medicare IM Given:    Medicare IM give by:    Date Additional Medicare IM Given:    Additional Medicare Important Message give by:     If discussed at Long Length of Stay Meetings, dates discussed:    Additional Comments:  Quintella BatonJulie W. Ayanah Snader, RN, BSN  Trauma/Neuro ICU Case Manager [phone]

## 2014-11-02 NOTE — Progress Notes (Signed)
I have seen and examined the patient. I agree with the findings above by Mr. Renae Fickleaul.  I discussed with the patient the risks and benefits of surgery for right tibial plateau, including the possibility of infection, nerve injury, vessel injury, wound breakdown, arthritis, symptomatic hardware, DVT/ PE, loss of motion, and need for further surgery among others.  We also specifically discussed the elevated risk of soft tissue breakdown that could lead to amputation.  He understood these risks and wished to proceed.   Budd PalmerHANDY,Darrius H, MD 11/02/2014 7:54 AM

## 2014-11-02 NOTE — Transfer of Care (Signed)
Immediate Anesthesia Transfer of Care Note  Patient: [name] [name]  Procedure(s) Performed: Procedure(s): OPEN REDUCTION INTERNAL FIXATION (ORIF) TIBIAL PLATEAU , REMOVAL OF EX FIX  (Right)  Patient Location: PACU  Anesthesia Type:General  Level of Consciousness: awake, alert  and oriented  Airway & Oxygen Therapy: Patient Spontanous Breathing and Patient connected to nasal cannula oxygen  Post-op Assessment: Report given to RN and Post -op Vital signs reviewed and stable  Post vital signs: Reviewed and stable  Last Vitals:  Filed Vitals:   11/02/14 0548  BP: 121/72  Pulse: 75  Temp: 36.7 C  Resp: 15    Complications: No apparent anesthesia complications

## 2014-11-02 NOTE — Anesthesia Preprocedure Evaluation (Addendum)
Anesthesia Evaluation  Patient identified by MRN, date of birth, ID band Patient awake    Reviewed: Allergy & Precautions, NPO status , Patient's Chart, lab work & pertinent test results  Airway Mallampati: II  TM Distance: >3 FB Neck ROM: Full    Dental no notable dental hx. (+) Edentulous Upper, Edentulous Lower   Pulmonary former smoker,  breath sounds clear to auscultation  Pulmonary exam normal       Cardiovascular negative cardio ROS Normal cardiovascular examRhythm:Regular Rate:Normal     Neuro/Psych negative neurological ROS  negative psych ROS   GI/Hepatic negative GI ROS, Neg liver ROS,   Endo/Other  negative endocrine ROS  Renal/GU negative Renal ROS  negative genitourinary   Musculoskeletal negative musculoskeletal ROS (+)   Abdominal   Peds negative pediatric ROS (+)  Hematology negative hematology ROS (+)   Anesthesia Other Findings   Reproductive/Obstetrics negative OB ROS                            Anesthesia Physical  Anesthesia Plan  ASA: II  Anesthesia Plan: General   Post-op Pain Management:    Induction: Intravenous  Airway Management Planned: Oral ETT  Additional Equipment:   Intra-op Plan:   Post-operative Plan: Extubation in OR  Informed Consent: I have reviewed the patients History and Physical, chart, labs and discussed the procedure including the risks, benefits and alternatives for the proposed anesthesia with the patient or authorized representative who has indicated his/her understanding and acceptance.     Plan Discussed with: CRNA, Surgeon and Anesthesiologist  Anesthesia Plan Comments:        Anesthesia Quick Evaluation

## 2014-11-02 NOTE — Progress Notes (Signed)
Patient ID: [name] [name], male   DOB: Jun 21, 1955, 59 y.o.   MRN: [id number]   LOS: 6 days   Subjective: Doing well, waiting for surgery this am.   Objective: Vital signs in last 24 hours: Temp:  [98.1 F (36.7 C)-98.5 F (36.9 C)] 98.1 F (36.7 C) (05/31 0548) Pulse Rate:  [70-79] 75 (05/31 0548) Resp:  [15-18] 15 (05/31 0548) BP: (105-121)/(72-77) 121/72 mmHg (05/31 0548) SpO2:  [99 %-100 %] 99 % (05/31 0548) Last BM Date: 11/01/14   Laboratory  CBC  Recent Labs  11/02/14 0608  WBC 11.4*  HGB 13.2  HCT 38.6*  PLT 279   BMET  Recent Labs  11/02/14 0608  NA 136  K 3.9  CL 101  CO2 27  GLUCOSE 97  BUN 15  CREATININE 1.03  CALCIUM 9.1    Physical Exam General appearance: alert and no distress Resp: clear to auscultation bilaterally Cardio: regular rate and rhythm GI: normal findings: bowel sounds normal and soft, non-tender Extremities: Warm, numbness in right foot   Assessment/Plan: Fall Left wrist fx s/p ORIF -- per Dr. Carola FrostHandy, NWB but can WBAT through elbow L1 compression fx -- TLSO per Dr. Yetta BarreJones Right tibia plateau/fibula fx s/p ex fix -- Plan definitive repair today per Dr. Carola FrostHandy, NWB ABL anemia -- Resolved FEN -- No issues VTE -- SCD's, Lovenox Dispo -- PT/OT    Freeman CaldronMichael J. Trinda Harlacher, PA-C Pager: [phone] General Trauma PA Pager: [phone]  11/02/2014

## 2014-11-02 NOTE — Brief Op Note (Signed)
10/27/2014 - 11/02/2014  10:08 AM  PATIENT:  [name] [name]  59 y.o. male  PRE-OPERATIVE DIAGNOSIS:   1. ORIF BICONDYLAR TIBIAL PLATEAU FRACTURE RIGHT 2. RETAINED EXTERNAL FIXATOR  POST-OPERATIVE DIAGNOSIS:   1. ORIF BICONDYLAR TIBIAL PLATEAU FRACTURE RIGHT 2. RETAINED EXTERNAL FIXATOR 3. INTACT LATERAL MENISCUS  PROCEDURES:  Procedure(s): 1. OPEN REDUCTION INTERNAL FIXATION (ORIF) TIBIAL PLATEAU 2. REMOVAL OF EX FIX  (Right) 3. STRESS FLOURO KNEE 4. CURETTAGE SKIN AND SUBCUTANEOUS TISSUE  SURGEON:  Surgeon(s) and Role:    * Myrene GalasMichael Matha Masse, MD - Primary  PHYSICIAN ASSISTANT: Montez MoritaKeith Paul, PA-C  ANESTHESIA:   general  I/O:  Total I/O In: 1000 [I.V.:1000] Out: -   SPECIMEN:  No Specimen  TOURNIQUET:  * No tourniquets in log *  DICTATION: .Other Dictation: Dictation Number [id number]

## 2014-11-02 NOTE — Anesthesia Procedure Notes (Signed)
Procedure Name: Intubation Date/Time: 11/02/2014 8:17 AM Performed by: Eligha Bridegroom Pre-anesthesia Checklist: Emergency Drugs available, Timeout performed, Patient identified, Suction available and Patient being monitored Patient Re-evaluated:Patient Re-evaluated prior to inductionOxygen Delivery Method: Circle system utilized Preoxygenation: Pre-oxygenation with 100% oxygen Intubation Type: IV induction Laryngoscope Size: Mac and 4 Grade View: Grade I Tube type: Oral Tube size: 7.5 mm Number of attempts: 1 Airway Equipment and Method: Stylet and LTA kit utilized Placement Confirmation: ETT inserted through vocal cords under direct vision,  breath sounds checked- equal and bilateral and positive ETCO2 Secured at: 21 cm Tube secured with: Tape Dental Injury: Teeth and Oropharynx as per pre-operative assessment

## 2014-11-02 NOTE — Anesthesia Postprocedure Evaluation (Signed)
Anesthesia Post Note  Patient: [name] [name]  Procedure(s) Performed: Procedure(s) (LRB): OPEN REDUCTION INTERNAL FIXATION (ORIF) TIBIAL PLATEAU , REMOVAL OF EX FIX  (Right)  Anesthesia type: general  Patient location: PACU  Post pain: Pain level controlled  Post assessment: Patient's Cardiovascular Status Stable  Last Vitals:  Filed Vitals:   11/02/14 1039  BP:   Pulse:   Temp: 36.8 C  Resp:     Post vital signs: Reviewed and stable  Level of consciousness: sedated  Complications: No apparent anesthesia complications

## 2014-11-03 ENCOUNTER — Encounter (HOSPITAL_COMMUNITY): Payer: Self-pay | Admitting: Orthopedic Surgery

## 2014-11-03 DIAGNOSIS — R338 Other retention of urine: Secondary | ICD-10-CM | POA: Diagnosis not present

## 2014-11-03 MED ORDER — METHOCARBAMOL 500 MG PO TABS
1000.0000 mg | ORAL_TABLET | Freq: Four times a day (QID) | ORAL | Status: DC | PRN
  Administered 2014-11-03 – 2014-11-05 (×4): 1000 mg via ORAL
  Filled 2014-11-03 (×4): qty 2

## 2014-11-03 MED ORDER — BETHANECHOL CHLORIDE 10 MG PO TABS
10.0000 mg | ORAL_TABLET | ORAL | Status: AC
  Administered 2014-11-03 (×5): 10 mg via ORAL
  Filled 2014-11-03 (×5): qty 1

## 2014-11-03 NOTE — Progress Notes (Signed)
Occupational Therapy Treatment Patient Details Name: [name] [name] MRN: [id number] DOB: 09-04-1955 Today's Date: 11/03/2014    History of present illness [name] [name] was on an extension ladder 15-20 feet high cutting a limb when it came down and struck the ladder. He jumped from the ladder and landed first on his feet and then fell backwards bracing himself with his arms. s/p left ORIF distal radius fx, moderate L1 anterior wedge compression fracture, Right tibia plateau/fibula s/p ORIF -- per Dr. Marcelino Scot, NWB   OT comments  Patient progressing nicely towards OT goals, continue plan of care for now. Administered hip kit to help increase independence with LB ADLs. Pt continues to require supervision at minimal for functional mobility and ADLs at this time. Pt with decreased short term memory and decreased recall of precautions and back brace wearing orders. Pt states his dad will be present and available to assist prn, recommending intermittent supervision/assistance with donning/doffing of brace and for higher level ADL/IADL tasks.    Follow Up Recommendations  No OT follow up;Supervision - Intermittent    Equipment Recommendations  3 in 1 bedside comode;Wheelchair (measurements OT);Wheelchair cushion (measurements OT) (elevating leg rests)    Recommendations for Other Services  None at this time  Precautions / Restrictions Precautions Precautions: Back Precaution Comments: Reiterated back precautions, pt only able to state "no lifting" Required Braces or Orthoses: Spinal Brace Spinal Brace: Applied in sitting position;Thoracolumbosacral orthotic Restrictions Weight Bearing Restrictions: Yes LUE Weight Bearing: Weight bear through elbow only RLE Weight Bearing: Non weight bearing    Mobility Bed Mobility Overal bed mobility: Needs Assistance Bed Mobility: Supine to Sit  Supine to sit: Supervision General bed mobility comments: Supervision for safety and cues for technique, WB status,  hand placement  Transfers Overall transfer level: Needs assistance Equipment used: Left platform walker Transfers: Sit to/from Stand Sit to Stand: Supervision General transfer comment: Supervision for safety and cues for hand placement, safety, and technique     Balance Overall balance assessment: Needs assistance Sitting-balance support: Single extremity supported Sitting balance-Leahy Scale: Good     Standing balance support: During functional activity Standing balance-Leahy Scale: Fair   ADL General ADL Comments: Administerred hip kit and educated pt on use of AE to increase independence with LB ADLs. Pt supervision for ADLs with use of AE, requiring cues for safety, back precautions, brace wearing orders     Cognition   Behavior During Therapy: WFL for tasks assessed/performed Overall Cognitive Status: Within Functional Limits for tasks assessed       Memory: Decreased recall of precautions;Decreased short-term memory                 Pertinent Vitals/ Pain       Pain Assessment: 0-10 Pain Score: 6  Pain Location: RLE Pain Descriptors / Indicators: Aching;Sore Pain Intervention(s): Monitored during session;Repositioned         Frequency Min 2X/week     Progress Toward Goals  OT Goals(current goals can now be found in the care plan section)  Progress towards OT goals: Progressing toward goals     Plan Discharge plan remains appropriate    End of Session Equipment Utilized During Treatment: Rolling walker;Back brace;Other (comment) (RLE brace)   Activity Tolerance Patient tolerated treatment well   Patient Left in chair;with call bell/phone within reach       Time: 1141-1158 OT Time Calculation (min): 17 min  Charges: OT General Charges $OT Visit: 1 Procedure OT Treatments $Self Care/Home Management : 8-22 mins  Andres Escandon , MS, OTR/L, CLT Pager: [phone]  11/03/2014, 12:19 PM

## 2014-11-03 NOTE — Progress Notes (Signed)
Orthopaedic Trauma Service Progress Note  Subjective  Doing ok R leg sore but feels good Denies numbness or tingling  Hinged brace fitting well  Review of Systems  Constitutional: Negative for fever and chills.  Respiratory: Negative for shortness of breath and wheezing.   Cardiovascular: Negative for chest pain and palpitations.  Gastrointestinal: Negative for nausea, vomiting and abdominal pain.  Genitourinary:       Trouble urinating      Objective   BP 123/89 mmHg  Pulse 82  Temp(Src) 98.7 F (37.1 C) (Oral)  Resp 16  Ht  (1.778 m)  Wt 71.9 kg (158 lb 8.2 oz)  BMI 22.74 kg/m2  SpO2 97%  Intake/Output      05/31 0701 - 06/01 0700 06/01 0701 - 06/02 0700   P.O. 240    I.V. (mL/kg) 1400 (19.5)    IV Piggyback     Total Intake(mL/kg) 1640 (22.8)    Urine (mL/kg/hr) 500 (0.3)    Blood 30 (0)    Total Output 530     Net +1110          Urine Occurrence 2 x      Exam  Gen: awake and alert, Resting comfortably in bed  Ext:    Left Upper Extremity     Splint fitting well   Motor and sensory functions intact   Ext warm     Left Lower Extremity    Hinged brace fitting well   Foot and heel elevated   Dressing c/d/i   Dpn, spn, tn sensation intact   EHL, FHL, AT, PT, peroneals, gastroc motor intact   + DP pulse   Swelling stable    No pain with passive stretch    Assessment and Plan   POD/HD#: 1   59 y/o male s/p fall from ladder  1. Fall from Ladder   2. Comminuted bicondylar R tibial plateau fracture with compartment syndrome                             NWB for now and 8 weeks post op    Unrestricted ROM R knee   Ice and elevate  Hinged brace  No pillows under bend of knee              PT/OT        commintued L distal radius fx s/p ORIF             NWB thru wrist             WBAT thru elbow             Finger, elbow, shoulder motion ok             Ice and elevate   Will convert to cast prior to dc        L1 compression fx              TLSO per NS   3. Pain management:             Continue per TS   4. ABL anemia/Hemodynamics             Stable               Cbc in am   5. Medical issues               Substance abuse based on tox screen- opiates and benzos could be  reflective of meds given at hospital but pt also + for cocaine               SW eval   6. DVT/PE prophylaxis:             Lovenox              7. ID:               Periop abx   8. Metabolic Bone Disease:             Vitamin D deficiency                           Will supplement                         Low free-T: will monitor   9. Activity:             As per #2  10. FEN/Foley/Lines:             advance diet as tolerated   11. Dispo:            resume therapies     Mearl LatinKeith W. Izaac Reisig, PA-C [facility] [phone](P) [phone] (O) 11/03/2014 8:28 AM

## 2014-11-03 NOTE — Progress Notes (Signed)
Met with pt to discuss dc plans.  Pt lives alone, but states his father lives next door, and cousin down the street.  He states he can stay with his father for a while, if needed.  PT/OT recommending no OP follow up, but recommend wheelchair with cushion, BSC, and Lt platform walker.  Pt is concerned about hospital bills, cost of DME and meds, as he has no insurance.  Pt eligible for medication assistance through [facility] program, and will provide letter at dc.  [facility] has an Hotel manager for DME he may be able to utilize, if eligible.    Pt for likely dc in am:  Will follow up with pt for discharge needs.    Reinaldo Raddle, RN, BSN  Trauma/Neuro ICU Case Manager [phone]

## 2014-11-03 NOTE — Progress Notes (Signed)
Patient ID: [name] [name], male   DOB: [birth year], 57 y.o.   MRN: [id number]   LOS: 7 days   Subjective: Having trouble urinating but it seems to be slowly getting better. Pain is increased but manageable.   Objective: Vital signs in last 24 hours: Temp:  [98.1 F (36.7 C)-98.7 F (37.1 C)] 98.7 F (37.1 C) (06/01 0607) Pulse Rate:  [51-82] 82 (06/01 0607) Resp:  [11-19] 16 (06/01 0607) BP: (123-151)/(77-98) 123/89 mmHg (06/01 0607) SpO2:  [97 %-100 %] 97 % (06/01 0607) Last BM Date: 11/01/14   Physical Exam General appearance: alert and no distress Resp: clear to auscultation bilaterally Cardio: regular rate and rhythm GI: normal findings: bowel sounds normal and soft, non-tender   Assessment/Plan: Fall Left wrist fx s/p ORIF -- per Dr. Carola FrostHandy, NWB but can WBAT through elbow L1 compression fx -- TLSO per Dr. Yetta BarreJones Right tibia plateau/fibula s/p ORIF -- per Dr. Carola FrostHandy, NWB FEN -- Give course of urecholine VTE -- SCD's, Lovenox Dispo -- PT/OT    [name] CaldronMichael J. Zyrell Carmean, PA-C Pager: [phone] General Trauma PA Pager: [phone]  11/03/2014

## 2014-11-03 NOTE — Clinical Social Work Note (Signed)
CSW received consult for patient needing resources for substance abuse.  Previous CSW met with patient on 11/01/14 and completed SBIRT and offered substance abuse resources.  Patient did admit to using cocaine socially with friends. Reports using once a month or less and feels at this time it is not a problem or barrier to his recovery.  Patient denies needs for substance abuse resources.  CSW to sign off on patient, please reconsult if needs arise.  Jones Broom. [place], MSW, [place] 11/03/2014 3:23 PM

## 2014-11-03 NOTE — Op Note (Signed)
[name], [name]             ACCOUNT NO.:  [id number]  MEDICAL RECORD NO.:  [id number]  LOCATION:  5N30C                        FACILITY:  [facility]  PHYSICIAN:  [name] [name]. Carola Frost, M.D. DATE OF BIRTH:  [birth year]  DATE OF PROCEDURE:  11/02/2014 DATE OF DISCHARGE:                              OPERATIVE REPORT   PREOPERATIVE DIAGNOSES: 1. Right bicondylar tibial plateau fracture. 2. Retained external fixator.  POSTOPERATIVE DIAGNOSES: 1. Right bicondylar tibial plateau fracture. 2. Retained external fixator. 3. Intact lateral meniscus.  PROCEDURE: 1. Open reduction and internal fixation of right bicondylar tibial     plateau. 2. Removal of external fixator under anesthesia. 3. Stress fluoroscopy of the right knee following repair,     demonstrating intact ligaments. 4. Curettage of the skin and subcutaneous tissue after removal of the     fixator.  SURGEON:  [name] [name]. Carola Frost, M.D.  ASSISTANT:  [name] Latin, PA-C.  ANESTHESIA:  General.  COMPLICATIONS:  None.  TOURNIQUET:  None.  DISPOSITION:  To PACU.  CONDITION:  Stable.  BRIEF SUMMARY OF INDICATION OF PROCEDURE:  [name] [name] is a very pleasant 59 year old, who sustained a left distal radius and right bicondylar tibial plateau fracture complicated by the anterior and lateral compartment syndrome during over a 20-foot fall from a tree. The patient underwent spanning external fixation with a dramatic improvement in his fracture alignment and reduction in addition to fasciotomies.  He has been in the hospital with ice, elevation, and compression in addition to the fixator and now returns to the OR for definitive repair.  He is status post ORIF of his left distal radius.  I discussed the risks and benefits of surgery including possibility of infection, nerve injury, vessel injury, DVT, PE, arthritis, loss of motion, symptomatic hardware, need for further surgery, and others.  The patient acknowledges these  risks and did wish to proceed.  BRIEF SUMMARY OF PROCEDURE:  [name] [name] was taken to the operating room where general anesthesia was induced.  His right lower extremity was prepped and draped in usual sterile fashion.  We did maintain the external fixator given the reduction that he had achieved in the relatively short period of time since application to definitive repair. We brought the C-arm to confirm that the medial metaphyseal side had not shortened and then proceeded with a standard anterolateral approach, carrying dissection down to the anterior compartment through a more anteriorly placed incision because of the previous lateral incision for the fasciotomies and exposed the tibia, maintaining the periosteum.  The retinaculum was incised 1 cm proximal to the joint line.  The coronary ligament was then incised and the joint capsule elevated.  The hematoma was evacuated and the knee lavaged with saline.  We then clearly saw the lateral meniscus and it was in excellent condition and intact. Furthermore, the joint reduction was quite excellent laterally and was congruent along the entire visible and palpable surface with the Freer. We then placed the plate checking its position on AP and lateral views and then placed a K-wire provisional fixation in to the lateral plateau. The [facility] clamp was brought medially and placed on the posteromedial metaphyseal spike.  The other tip of the  [place] clamp was placed anterolaterally and then compressed in order to further elevate the medial plateau into an anatomic position, once this was achieved K-wires was driven across to the medial side and then 2 standard screws to maximally appose the plated bone and maintain our reduction.  The additional lock screws were placed into the subchondral segment across both plateaus and then a standard most distal screw and then 3 distal locks in additional lock fixation proximally including 1 kickstand.   The metaphyseal region was largely bridged.  The fixator was then removed after placing talus to protect the operative area and the C-arm brought in under live fluoro.  Then the plateau was checked and there was no gapping noted with varus or valgus stress to suggest additional ligamentous injury.  The final images were obtained on AP and lateral projections and then all wounds were irrigated thoroughly and closed in a standard layered fashion.  Furthermore, we did curettage the external fixator pin sites and irrigated these thoroughly.  Compressive dressing was applied after standard layered closure using 0 Prolene to repair the coronary ligament with a vertical mattress suture and then a #1 figure- of-eight for the IT band retinaculum and a 2-0 Vicryl and 3-0 nylon for the skin.  The patient was taken to PACU in stable condition.  [name] MoritaKeith Paul, PA-C did assist me throughout and his assistance was necessary to maintain the reduction and exposure during provisional definitive fixation.  Furthermore, he assisted with placement and removal and also simultaneous closure.  PROGNOSIS:  [name] [name] will be nonweightbearing on the right lower extremity with unrestricted range of motion.  He will be on formal pharmacologic DVT prophylaxis with Lovenox and discharged with 20-day supply if economically feasible.  He will be weightbearing as tolerated on the left elbow with a platform walker and nonweightbearing to the left wrist.  Because of the compartment syndrome, this area has slightly increased risk for infection and the articular injury of course increases his chance of loss of motion, arthritis, and the need for further surgery including total knee arthroplasty in the future.     [name] AlbinoMichael H. Carola [name], M.D.     MHH/MEDQ  D:  11/02/2014  T:  11/03/2014  Job:  [id number]

## 2014-11-03 NOTE — Progress Notes (Signed)
PT Cancellation Note  Patient Details Name: [name] [name] MRN: [id number] DOB: [birth year]   Cancelled Treatment:    Reason Eval/Treat Not Completed: Patient declined, no reason specified Pt declined participating in therapy x2 this PM secondary to pain. Will follow up next available time.   Blake DivineShauna A Malaquias Lenker 11/03/2014, 4:36 PM  Mylo RedShauna Darlyn Repsher, PT, DPT [phone]

## 2014-11-04 LAB — URINALYSIS, ROUTINE W REFLEX MICROSCOPIC
BILIRUBIN URINE: NEGATIVE
Glucose, UA: NEGATIVE mg/dL
Hgb urine dipstick: NEGATIVE
KETONES UR: NEGATIVE mg/dL
Leukocytes, UA: NEGATIVE
Nitrite: NEGATIVE
PROTEIN: NEGATIVE mg/dL
SPECIFIC GRAVITY, URINE: 1.016 (ref 1.005–1.030)
Urobilinogen, UA: 4 mg/dL — ABNORMAL HIGH (ref 0.0–1.0)
pH: 5.5 (ref 5.0–8.0)

## 2014-11-04 LAB — VITAMIN D 1,25 DIHYDROXY
Vitamin D 1, 25 (OH)2 Total: 22 pg/mL
Vitamin D2 1, 25 (OH)2: <10 pg/mL
Vitamin D3 1, 25 (OH)2: 22 pg/mL

## 2014-11-04 MED ORDER — TAMSULOSIN HCL 0.4 MG PO CAPS
0.8000 mg | ORAL_CAPSULE | Freq: Every day | ORAL | Status: DC
  Administered 2014-11-04 – 2014-11-05 (×2): 0.8 mg via ORAL
  Filled 2014-11-04 (×2): qty 2

## 2014-11-04 MED ORDER — VITAMIN D 1000 UNITS PO TABS
1000.0000 [IU] | ORAL_TABLET | Freq: Two times a day (BID) | ORAL | Status: DC
  Administered 2014-11-04 – 2014-11-05 (×3): 1000 [IU] via ORAL
  Filled 2014-11-04 (×3): qty 1

## 2014-11-04 MED ORDER — BETHANECHOL CHLORIDE 25 MG PO TABS
25.0000 mg | ORAL_TABLET | Freq: Four times a day (QID) | ORAL | Status: DC
  Administered 2014-11-04 – 2014-11-05 (×6): 25 mg via ORAL
  Filled 2014-11-04 (×6): qty 1

## 2014-11-04 MED ORDER — VITAMIN D (ERGOCALCIFEROL) 1.25 MG (50000 UNIT) PO CAPS
50000.0000 [IU] | ORAL_CAPSULE | ORAL | Status: DC
  Administered 2014-11-04: 50000 [IU] via ORAL
  Filled 2014-11-04: qty 1

## 2014-11-04 MED ORDER — VITAMIN C 500 MG PO TABS
500.0000 mg | ORAL_TABLET | Freq: Two times a day (BID) | ORAL | Status: DC
  Administered 2014-11-04 – 2014-11-05 (×3): 500 mg via ORAL
  Filled 2014-11-04 (×3): qty 1

## 2014-11-04 NOTE — Progress Notes (Signed)
Foley catheter inserted around 0900 with significant amount of yellow clear urine return. Empted bag for upon insertion. Pt states he feels much relief.

## 2014-11-04 NOTE — Progress Notes (Signed)
Orthopaedic Trauma Service Progress Note  Subjective  Doing ok from ortho standpoint Cant void  Pain tolerable   ROS As above   Objective   BP 125/88 mmHg  Pulse 82  Temp(Src) 98.2 F (36.8 C) (Oral)  Resp 16  Ht 5\' 10"  (1.778 m)  Wt 71.9 kg (158 lb 8.2 oz)  BMI 22.74 kg/m2  SpO2 99%  Intake/Output      06/01 0701 - 06/02 0700 06/02 0701 - 06/03 0700   P.O. 720    I.V. (mL/kg)     Total Intake(mL/kg) 720 (10)    Urine (mL/kg/hr) 1275 (0.7)    Blood     Total Output 1275     Net -555          Urine Occurrence 2 x      Labs No new labs  Exam  Gen: awake and alert, Resting comfortably in bed   Abd: +BS, NT Ext:               Left Upper Extremity                           Splint fitting well                         Motor and sensory functions intact                         Ext warm                            right Lower Extremity                           Hinged brace fitting well                         Foot and heel elevated                         Dressing c/d/i                         Dpn, spn, tn sensation intact                         EHL, FHL, AT, PT, peroneals, gastroc motor intact                         + DP pulse                         Swelling stable                           No pain with passive stretch    Assessment and Plan   POD/HD#: 2   59 y/o male s/p fall from ladder  1. Fall from Ladder   2. Comminuted bicondylar R tibial plateau fracture with compartment syndrome                             NWB for now and 8 weeks post op  Unrestricted ROM R knee               Ice and elevate             Hinged brace             No pillows under bend of knee               PT/OT   Dressing change tomorrow        commintued L distal radius fx s/p ORIF             NWB thru wrist             WBAT thru elbow             Finger, elbow, shoulder motion ok             Ice and elevate               will place in [facility] tomorrow         L1 compression fx               TLSO per NS   3. Pain management:             Continue per TS   4. ABL anemia/Hemodynamics             Stable                  5. Medical issues               Substance abuse based on tox screen- opiates and benzos could be reflective of meds given at hospital but pt also + for cocaine               SW eval   6. DVT/PE prophylaxis:             Lovenox              7. ID:               Periop abx   8. Metabolic Bone Disease:             Vitamin D deficiency                           Will supplement                         Low free-T: will monitor    Recheck labs in 12 weeks   9. Activity:             As per #2  10. FEN/Foley/Lines:             advance diet as tolerated   Foley per TS for Urinary Retention   11. Dispo:            resume therapies      Mearl Latin, PA-C [facility] [phone] (O) 11/04/2014 8:51 AM

## 2014-11-04 NOTE — Progress Notes (Signed)
Patient ID: [name] CoonsMichael E [name], male   DOB: Mar 02, 1956, 59 y.o.   MRN: [id number]   LOS: 8 days   Subjective: Still can't void.   Objective: Vital signs in last 24 hours: Temp:  [97.9 F (36.6 C)-98.2 F (36.8 C)] 98.2 F (36.8 C) (06/02 0401) Pulse Rate:  [82-86] 82 (06/02 0401) Resp:  [16] 16 (06/02 0401) BP: (125-129)/(78-88) 125/88 mmHg (06/02 0401) SpO2:  [99 %-100 %] 99 % (06/02 0401) Last BM Date: 11/03/14   Physical Exam General appearance: alert and no distress Resp: clear to auscultation bilaterally Cardio: regular rate and rhythm GI: normal findings: bowel sounds normal and soft, non-tender Extremities: NVI   Assessment/Plan: Fall Left wrist fx s/p ORIF -- per Dr. Carola FrostHandy, NWB but can WBAT through elbow L1 compression fx -- TLSO per Dr. Yetta BarreJones Right tibia plateau/fibula s/p ORIF -- per Dr. Carola FrostHandy, NWB Urinary retention -- Start urecholine, Flomax, place foley FEN -- No issues VTE -- SCD's, Lovenox Dispo -- PT/OT, home once retention resolved    [name] CaldronMichael J. Sandor Arboleda, PA-C Pager: [phone] General Trauma PA Pager: [phone]  11/04/2014

## 2014-11-04 NOTE — Progress Notes (Signed)
Physical Therapy Treatment Patient Details Name: [name] [name] MRN: [id number] DOB: 07/29/1955 Today's Date: 11/04/2014    History of Present Illness Casimiro NeedleMichael was on an extension ladder 15-20 feet high cutting a limb when it came down and struck the ladder. He jumped from the ladder and landed first on his feet and then fell backwards bracing himself with his arms. s/p left ORIF distal radius fx, moderate L1 anterior wedge compression fracture, Right tibia plateau/fibula s/p ORIF -- per Dr. Carola FrostHandy, NWB                    PT Comments    Patient progressing well towards PT goals. Continues to have some difficulty with bed mobility while maintaining back and WB precautions. Fatigues easily during gait requiring short standing rest breaks. Pt having difficulty voiding- now with foley catheter. Encourage OOB as much as tolerated and AROM right knee. Will continue to follow to maximize independence and safety prior to return home.   Follow Up Recommendations  No PT follow up     Equipment Recommendations  Other (comment);Rolling walker with 5" wheels;Wheelchair (measurements PT);Wheelchair cushion (measurements PT)    Recommendations for Other Services       Precautions / Restrictions Precautions Precautions: Back Precaution Booklet Issued: No Precaution Comments: Reiterated back precautions. Required Braces or Orthoses: Spinal Brace Spinal Brace: Applied in sitting position;Thoracolumbosacral orthotic Restrictions Weight Bearing Restrictions: Yes LUE Weight Bearing: Non weight bearing (except through elbow) RLE Weight Bearing: Non weight bearing    Mobility  Bed Mobility Overal bed mobility: Needs Assistance Bed Mobility: Supine to Sit;Sit to Supine     Supine to sit: Supervision;HOB elevated Sit to supine: Min assist   General bed mobility comments: Min A to bring RLE into bed. Instructed pt to use LLE to assist RLE into bed. Use of rail for support. Cues for WB  status.  Transfers Overall transfer level: Needs assistance Equipment used: Left platform walker Transfers: Sit to/from Stand Sit to Stand: Supervision         General transfer comment: Supervision for safety and cues for hand placement, safety, and technique.  Ambulation/Gait Ambulation/Gait assistance: Min guard Ambulation Distance (Feet): 100 Feet Assistive device: Left platform walker Gait Pattern/deviations: Step-to pattern   Gait velocity interpretation: Below normal speed for age/gender General Gait Details: Hop to gait pattern, steady. Fatigues with multiple short standing rest breaks. Compliant with NWB RLE.   Stairs            Wheelchair Mobility    Modified Rankin (Stroke Patients Only)       Balance Overall balance assessment: Needs assistance Sitting-balance support: Feet supported;No upper extremity supported Sitting balance-Leahy Scale: Good     Standing balance support: During functional activity Standing balance-Leahy Scale: Fair                      Cognition Arousal/Alertness: Awake/alert Behavior During Therapy: WFL for tasks assessed/performed Overall Cognitive Status: Within Functional Limits for tasks assessed       Memory: Decreased recall of precautions              Exercises General Exercises - Lower Extremity Ankle Circles/Pumps: Right;10 reps;Seated Quad Sets: Right;5 reps;Supine Long Arc Quad: AAROM;Right;10 reps;Seated    General Comments        Pertinent Vitals/Pain Pain Assessment: Faces Faces Pain Scale: Hurts little more Pain Location: RLE Pain Descriptors / Indicators: Sore;Aching Pain Intervention(s): Monitored during session;Repositioned    Home Living  Prior Function            PT Goals (current goals can now be found in the care plan section) Progress towards PT goals: Progressing toward goals    Frequency  Min 3X/week    PT Plan Current plan remains  appropriate    Co-evaluation             End of Session Equipment Utilized During Treatment: Gait belt;Back brace Activity Tolerance: Patient tolerated treatment well Patient left: in bed;with call bell/phone within reach     Time: 1123-1147 PT Time Calculation (min) (ACUTE ONLY): 24 min  Charges:  $Gait Training: 8-22 mins $Therapeutic Exercise: 8-22 mins                    G Codes:      Dickson Kostelnik A Eiden Bagot 11/04/2014, 11:58 AM  Mylo Red, PT, DPT [phone]

## 2014-11-05 MED ORDER — CHOLECALCIFEROL 25 MCG (1000 UT) PO TABS: 1000.0000 [IU] | ORAL_TABLET | Freq: Two times a day (BID) | ORAL | Status: AC

## 2014-11-05 MED ORDER — OXYCODONE-ACETAMINOPHEN 7.5-325 MG PO TABS: 1.0000 | ORAL_TABLET | ORAL | Status: AC | PRN

## 2014-11-05 MED ORDER — ENOXAPARIN SODIUM 40 MG/0.4ML ~~LOC~~ SOLN: 40.0000 mg | SUBCUTANEOUS | Status: AC

## 2014-11-05 MED ORDER — METHOCARBAMOL 500 MG PO TABS: 1000.0000 mg | ORAL_TABLET | Freq: Four times a day (QID) | ORAL | Status: AC | PRN

## 2014-11-05 MED ORDER — VITAMIN D (ERGOCALCIFEROL) 1.25 MG (50000 UNIT) PO CAPS: 50000.0000 [IU] | ORAL_CAPSULE | ORAL | Status: AC

## 2014-11-05 MED ORDER — ASCORBIC ACID 500 MG PO TABS: 500.0000 mg | ORAL_TABLET | Freq: Two times a day (BID) | ORAL | Status: AC

## 2014-11-05 NOTE — Progress Notes (Signed)
Orthopedic Tech Progress Note Patient Details:  [name] CoonsMichael E [name] 10-Jun-1955 [id number]  Casting Type of Cast: Short arm cast Cast Location: lue  As ordered by PA Kennon HolterKEITH   Destanie Tibbetts 11/05/2014, 2:23 PM

## 2014-11-05 NOTE — Progress Notes (Signed)
Orthopaedic Trauma Service Progress Note  Subjective  Doing well No specific complaints Tolerating foley Will be dc'd home with foley   ROS As above   Objective   BP 113/69 mmHg  Pulse 77  Temp(Src) 98.3 F (36.8 C) (Oral)  Resp 16  Ht 5\' 10"  (1.778 m)  Wt 71.9 kg (158 lb 8.2 oz)  BMI 22.74 kg/m2  SpO2 93%  Intake/Output      06/02 0701 - 06/03 0700 06/03 0701 - 06/04 0700   P.O. 840    Total Intake(mL/kg) 840 (11.7)    Urine (mL/kg/hr) 2400 (1.4)    Stool 0 (0)    Total Output 2400     Net -1560          Stool Occurrence 1 x       Labs  Results for [name] [name] (MRN [id number]) as of 11/05/2014 09:03  Ref. Range 11/04/2014 09:10  Appearance Latest Ref Range: CLEAR  CLEAR  Bilirubin Urine Latest Ref Range: NEGATIVE  NEGATIVE  Color, Urine Latest Ref Range: YELLOW  AMBER (A)  Glucose Latest Ref Range: NEGATIVE mg/dL NEGATIVE  Hgb urine dipstick Latest Ref Range: NEGATIVE  NEGATIVE  Ketones, ur Latest Ref Range: NEGATIVE mg/dL NEGATIVE  Leukocytes, UA Latest Ref Range: NEGATIVE  NEGATIVE  Nitrite Latest Ref Range: NEGATIVE  NEGATIVE  pH Latest Ref Range: 5.0-8.0  5.5  Protein Latest Ref Range: NEGATIVE mg/dL NEGATIVE  Specific Gravity, Urine Latest Ref Range: 1.005-1.030  1.016  Urobilinogen, UA Latest Ref Range: 0.0-1.0 mg/dL 4.0 (H)    Exam  Gen: resting comfortably in bed, NAD  Ext:       Left Upper Extremity   Splint removed  Incision looks great  No drainage, no erythema  Swelling stable  Motor and sensory functions intact  Ext warm      Right Lower Extremity                           Hinged brace fitting well                         Foot and heel elevated                         Dressing removed   Incisions look great   No signs of infection   No drainage   pinsites stable as well                          Dpn, spn, tn sensation intact                         EHL, FHL, AT, PT, peroneals, gastroc motor intact                         +  DP pulse                         Swelling stable                           No pain with passive stretch    Assessment and Plan   POD/HD#: 753   59 y/o male s/p fall from ladder  1. Fall from Ladder   2. Comminuted  bicondylar R tibial plateau fracture with compartment syndrome                             NWB for now and 8 weeks post op               Unrestricted ROM R knee               Ice and elevate             Hinged brace- can be off when in bed or chair, on during mobilizations              No pillows under bend of knee               PT/OT               Dressing changed  Change dressing as needed        commintued L distal radius fx s/p ORIF             NWB thru wrist             WBAT thru elbow             Finger, elbow, shoulder motion ok             Ice and elevate    SAC applied        L1 compression fx               TLSO per NS   3. Pain management:             Continue per TS   4. ABL anemia/Hemodynamics             Stable                  5. Medical issues               Substance abuse based on tox screen- opiates and benzos could be reflective of meds given at hospital but pt also + for cocaine               SW eval   6. DVT/PE prophylaxis:             Lovenox              7. ID:               Periop abx   8. Metabolic Bone Disease:             Vitamin D deficiency                           Will supplement                         Low free-T: will monitor               Recheck labs in 12 weeks   9. Activity:             As per #2  10. FEN/Foley/Lines:             advance diet as tolerated               Foley per TS for Urinary Retention   11. Dispo:            follow up with ortho  in 10 days     Mearl Latin, PA-C [facility] [phone] (O) 11/05/2014 9:02 AM

## 2014-11-05 NOTE — Care Management Note (Signed)
Case Management Note  Patient Details  Name: [name] CoonsMichael E [name] MRN: [id number] Date of Birth: 1955-12-20  Subjective/Objective:    Pt for dc home today.  He states he will dc to uncle[place] [address] at [address] [place] .  He will dc home with foley, and [facility].  Pt also needs DME as recommended by PT/OT.  He is uninsured, and will need assistance with medications upon dc.                  Action/Plan: [facility].  [facility]; [facility] with explanation of [facility] benefits.  [facility]; pt states his ride will be able to transport any DME home today upon dc.    Expected Discharge Date:    11/05/14              Expected Discharge Plan:  Home w Home Health Services  In-House Referral:  Clinical Social Work  Discharge planning Services  CM Consult, Medication Assistance, MATCH Program  Post Acute Care Choice:  Home Health Choice offered to:   patient  DME Arranged:  3-N-1, Government social research officerWheelchair manual, Scientific laboratory technicianWalker platform DME Agency:  [facility]  HH Arranged:  RN HH Agency:  [facility]  Status of Service:  Completed, signed off  Medicare Important Message Given:  No Date Medicare IM Given:    Medicare IM give by:    Date Additional Medicare IM Given:    Additional Medicare Important Message give by:     If discussed at Long Length of Stay Meetings, dates discussed:    Additional Comments:  Quintella BatonJulie W. Ting Cage, RN, BSN  Trauma/Neuro ICU Case Manager [phone]

## 2014-11-05 NOTE — Progress Notes (Signed)
Patient ID: Breck CoonsMichael E Mcquaig, male   DOB: [birth year], 57 y.o.   MRN: [id number]   LOS: 9 days   Subjective: Feels much better with foley in.   Objective: Vital signs in last 24 hours: Temp:  [98.3 F (36.8 C)-98.9 F (37.2 C)] 98.3 F (36.8 C) (06/03 0454) Pulse Rate:  [77-94] 77 (06/03 0454) Resp:  [16] 16 (06/03 0454) BP: (107-113)/(68-74) 113/69 mmHg (06/03 0454) SpO2:  [93 %-97 %] 93 % (06/03 0454) Last BM Date: 11/04/14   Physical Exam General appearance: alert and no distress Resp: clear to auscultation bilaterally Cardio: regular rate and rhythm GI: normal findings: bowel sounds normal and soft, non-tender   Assessment/Plan: Fall Left wrist fx s/p ORIF -- per Dr. Carola FrostHandy, NWB but can WBAT through elbow L1 compression fx -- TLSO per Dr. Yetta BarreJones Right tibia plateau/fibula s/p ORIF -- per Dr. Carola FrostHandy, NWB Urinary retention -- Urecholine, Flomax, foley. Will give voiding trial tomorrow and D/C Sunday if he does ok. It's possible his bladder distension will necessitate replacing the foley in which case he'll need to go home with the foley with OP urology f/u. FEN -- No issues VTE -- SCD's, Lovenox Dispo -- PT/OT, pt to check with dad regarding dispo plan after dad's wife/gf possibly objected.    Freeman CaldronMichael J. Boris Engelmann, PA-C Pager: [phone] General Trauma PA Pager: [phone]  11/05/2014

## 2014-11-05 NOTE — Progress Notes (Signed)
Physical Therapy Treatment Patient Details Name: [name] [name] MRN: [id number] DOB: Oct 28, 1955 Today's Date: 11/05/2014    History of Present Illness Bastian was on an extension ladder 15-20 feet high cutting a limb when it came down and struck the ladder. He jumped from the ladder and landed first on his feet and then fell backwards bracing himself with his arms. s/p left ORIF distal radius fx, moderate L1 anterior wedge compression fracture, Right tibia plateau/fibula s/p ORIF -- per Dr. Carola Frost, NWB                    PT Comments    Patient continues to make great progress with ambulation and compliance with NWB status. Patient is now planning to DC to his uncles house. He will have a ramp to enter the house. Continue with current POC.   Follow Up Recommendations  No PT follow up     Equipment Recommendations  Rolling walker with 5" wheels;Wheelchair (measurements PT);Wheelchair cushion (measurements PT) (Platform on L)    Recommendations for Other Services       Precautions / Restrictions Precautions Precautions: Back Precaution Comments: Reiterated back precautions, handout given  Required Braces or Orthoses: Spinal Brace Spinal Brace: Applied in sitting position;Thoracolumbosacral orthotic Restrictions Weight Bearing Restrictions: Yes LUE Weight Bearing: Weight bear through elbow only RLE Weight Bearing: Non weight bearing    Mobility  Bed Mobility Overal bed mobility: Needs Assistance Bed Mobility: Supine to Sit     Supine to sit: Supervision;HOB elevated     General bed mobility comments: Patient found seated in recliner upon OT entering/exiting room  Transfers Overall transfer level: Needs assistance Equipment used: Left platform walker   Sit to Stand: Supervision         General transfer comment: No transfer completed with OT, pt just got into recliner after PT session.   Ambulation/Gait Ambulation/Gait assistance: Supervision Ambulation Distance  (Feet): 120 Feet Assistive device: Left platform walker Gait Pattern/deviations: Step-to pattern     General Gait Details: Hop to gait pattern, steady. Fatigues with multiple short standing rest breaks. Compliant with NWB RLE.   Stairs            Wheelchair Mobility    Modified Rankin (Stroke Patients Only)       Balance                                    Cognition Arousal/Alertness: Awake/alert Behavior During Therapy: WFL for tasks assessed/performed Overall Cognitive Status: Within Functional Limits for tasks assessed       Memory: Decreased recall of precautions              Exercises      General Comments        Pertinent Vitals/Pain Pain Assessment: No/denies pain    Home Living                      Prior Function            PT Goals (current goals can now be found in the care plan section) Progress towards PT goals: Progressing toward goals    Frequency  Min 3X/week    PT Plan Current plan remains appropriate    Co-evaluation             End of Session Equipment Utilized During Treatment: Gait belt;Back brace Activity Tolerance: Patient tolerated treatment well  Patient left: in chair;with call bell/phone within reach     Time: 1030-1046 PT Time Calculation (min) (ACUTE ONLY): 16 min  Charges:  $Gait Training: 8-22 mins                    G Codes:      [name] [name], [name] [name] 11/05/2014, 12:38 PM  11/05/2014 [name] Birksobinette, [name] [name] PTA [phone] pager [phone] office

## 2014-11-05 NOTE — Discharge Summary (Signed)
Physician Discharge Summary  Patient ID: [name] [name] MRN: [id number] DOB/AGE: [birth year] 57 y.o.  Admit date: 10/27/2014 Discharge date: 11/05/2014  Discharge Diagnoses Patient Active Problem List   Diagnosis Date Noted  . Acute urinary retention 11/03/2014  . Acute blood loss anemia 10/29/2014  . Fracture of right tibial plateau 10/27/2014  . Fracture of head of right fibula 10/27/2014  . Compression fracture of L1 lumbar vertebra 10/27/2014  . Closed fracture of left radius 10/27/2014  . Dislocation of left ulnar styloid 10/27/2014  . Fall from ladder 10/27/2014    Consultants Drs. Beverely Low and Myrene Galas for orthopedic surgery  Dr. Marikay Alar for neurosurgery   Procedures 5/26 -- Closed reduction of right tibial plateau fracture, application of spanning external fixator, right leg, open reduction and internal fixation of left distal radius fracture, fasciotomy of anterior and lateral compartments, and measurement of right leg compartments with needle manometer by Dr. Carola Frost  5/31 -- Open reduction and internal fixation of right bicondylar tibial plateau, removal of external fixator under anesthesia, stress fluoroscopy of the right knee following repair, demonstrating intact ligaments, and curettage of the skin and subcutaneous tissue after removal of the fixator by Dr. Carola Frost   HPI: [name] [name] was on an extension ladder 15-20 feet high cutting a limb when it came down and struck the ladder. He jumped from the ladder and landed first on his feet and then fell backwards bracing himself with his arms. He denied loss of consciousness. He was brought to the ED as a level 2 trauma. His workup included CT scans of his head and entire spine as well as extremity x-rays and showed the above-mentioned injuries. He was admitted to the trauma service and orthopedic surgery and neurosurgery were consulted.   Hospital Course: Neurosurgery recommended non-operative treatment of his lumbar  fracture in a TLSO. Orthopedic surgery recommended stabilization with evaluation by the traumatic orthopedic specialist the following day. The patient declined to have his right leg splinted in extension. This necessitated a staged plan for his orthopedic repair. He went the next day for the first procedure. Following this he was mobilized with physical and occupational therapies who recommended home once he was finished with his procedures. He developed an acute blood loss anemia that did not require transfusion. He returned to the OR for his final procedure. After this he developed acute urinary retention and had to have a foley placed. This drained over a liter of urine so the decision was made to discharge the patient with the foley in place. His pain was controlled on oral medications and he was discharged home in good condition.     Medication List    TAKE these medications        ascorbic acid 500 MG tablet  Commonly known as:  VITAMIN C  Take 1 tablet (500 mg total) by mouth 2 (two) times daily.     Cholecalciferol 1000 UNITS tablet  Take 1 tablet (1,000 Units total) by mouth 2 (two) times daily.     enoxaparin 40 MG/0.4ML injection  Commonly known as:  LOVENOX  Inject 0.4 mLs (40 mg total) into the skin daily.     methocarbamol 500 MG tablet  Commonly known as:  ROBAXIN  Take 2 tablets (1,000 mg total) by mouth every 6 (six) hours as needed for muscle spasms.     oxyCODONE-acetaminophen 7.5-325 MG per tablet  Commonly known as:  PERCOCET  Take 1-2 tablets by mouth every 4 (four) hours as needed.  Vitamin D (Ergocalciferol) 50000 UNITS Caps capsule  Commonly known as:  DRISDOL  Take 1 capsule (50,000 Units total) by mouth every 7 (seven) days.        Follow-up Information    Follow up with HANDY,Saverio H, MD In 10 days.   Specialty:  Orthopedic Surgery   Why:  For suture removal, For wound re-check   Contact information:   [address] [place] [address] [phone]       Follow up with [facility] On 11/17/2014.   Why:  2:00PM   Contact information:   [address] [place] [phone]      Schedule an appointment as soon as possible for a visit with Tia AlertJONES,DAVID S, MD.   Specialty:  Neurosurgery   Contact information:   [address] [place] [address] [phone]        Signed: Freeman CaldronMichael J. Sharvil Hoey, PA-C Pager: [phone] General Trauma PA Pager: [phone] 11/05/2014, 10:45 AM

## 2014-11-05 NOTE — Progress Notes (Signed)
Denies back or leg pain, walking well. F/u with me 2-3 weeks after D/C

## 2014-11-05 NOTE — Progress Notes (Signed)
Orthopedic Tech Progress Note Patient Details:  [name] [name] Apr 14, 1956 [id number]  Casting Type of Cast: Short arm cast Cast Location: lue  As ordered by PA Kennon HolterKeith   [name] [name] 11/05/2014, 2:20 PM

## 2014-11-05 NOTE — Discharge Instructions (Signed)
Orthopaedic Trauma Service Discharge Instructions   General Discharge Instructions  WEIGHT BEARING STATUS: Nonweighbearing R leg and L wrist. Ok to [employer] through L elbow   RANGE OF MOTION/ACTIVITY: range of motion R knee as tolerated. Do home exercise program as shown by therapists   PAIN MEDICATION USE AND EXPECTATIONS  You have likely been given narcotic medications to help control your pain.  After a traumatic event that results in an fracture (broken bone) with or without surgery, it is ok to use narcotic pain medications to help control one's pain.  We understand that everyone responds to pain differently and each individual patient will be evaluated on a regular basis for the continued need for narcotic medications. Ideally, narcotic medication use should last no more than 6-8 weeks (coinciding with fracture healing).   As a patient it is your responsibility as well to monitor narcotic medication use and report the amount and frequency you use these medications when you come to your office visit.   We would also advise that if you are using narcotic medications, you should take a dose prior to therapy to maximize you participation.  IF YOU ARE ON NARCOTIC MEDICATIONS IT IS NOT PERMISSIBLE TO OPERATE A MOTOR VEHICLE (MOTORCYCLE/CAR/TRUCK/MOPED) OR HEAVY MACHINERY DO NOT MIX NARCOTICS WITH OTHER CNS (CENTRAL NERVOUS SYSTEM) DEPRESSANTS SUCH AS ALCOHOL  Diet: as you were eating previously.  Can use over the counter stool softeners and bowel preparations, such as Miralax, to help with bowel movements.  Narcotics can be constipating.  Be sure to drink plenty of fluids  Wound Care: daily dressing changes to Right leg starting on 11/07/2014. See instructions below.  Do not get cast on L arm wet   STOP SMOKING OR USING NICOTINE PRODUCTS!!!!  As discussed nicotine severely impairs your body's ability to heal surgical and traumatic wounds but also impairs bone healing.  Wounds and bone heal by  forming microscopic blood vessels (angiogenesis) and nicotine is a vasoconstrictor (essentially, shrinks blood vessels).  Therefore, if vasoconstriction occurs to these microscopic blood vessels they essentially disappear and are unable to deliver necessary nutrients to the healing tissue.  This is one modifiable factor that you can do to dramatically increase your chances of healing your injury.    (This means no smoking, no nicotine gum, patches, etc)  DO NOT USE NONSTEROIDAL ANTI-INFLAMMATORY DRUGS (NSAID'S)  Using products such as Advil (ibuprofen), Aleve (naproxen), Motrin (ibuprofen) for additional pain control during fracture healing can delay and/or prevent the healing response.  If you would like to take over the counter (OTC) medication, Tylenol (acetaminophen) is ok.  However, some narcotic medications that are given for pain control contain acetaminophen as well. Therefore, you should not exceed more than 4000 mg of tylenol in a day if you do not have liver disease.  Also note that there are may OTC medicines, such as cold medicines and allergy medicines that my contain tylenol as well.  If you have any questions about medications and/or interactions please ask your doctor/PA or your pharmacist.      ICE AND ELEVATE INJURED/OPERATIVE EXTREMITY  Using ice and elevating the injured extremity above your heart can help with swelling and pain control.  Icing in a pulsatile fashion, such as 20 minutes on and 20 minutes off, can be followed.    Do not place ice directly on skin. Make sure there is a barrier between to skin and the ice pack.    Using frozen items such as frozen peas works  well as the conform nicely to the are that needs to be iced.  USE AN ACE WRAP OR TED HOSE FOR SWELLING CONTROL  In addition to icing and elevation, Ace wraps or TED hose are used to help limit and resolve swelling.  It is recommended to use Ace wraps or TED hose until you are informed to stop.    When using Ace  Wraps start the wrapping distally (farthest away from the body) and wrap proximally (closer to the body)   Example: If you had surgery on your leg or thing and you do not have a splint on, start the ace wrap at the toes and work your way up to the thigh        If you had surgery on your upper extremity and do not have a splint on, start the ace wrap at your fingers and work your way up to the upper arm  IF YOU ARE IN A SPLINT OR CAST DO NOT REMOVE IT FOR ANY REASON   If your splint gets wet for any reason please contact the office immediately. You may shower in your splint or cast as long as you keep it dry.  This can be done by wrapping in a cast cover or garbage back (or similar)  Do Not stick any thing down your splint or cast such as pencils, money, or hangers to try and scratch yourself with.  If you feel itchy take benadryl as prescribed on the bottle for itching  IF YOU ARE IN A CAM BOOT (BLACK BOOT)  You may remove boot periodically. Perform daily dressing changes as noted below.  Wash the liner of the boot regularly and wear a sock when wearing the boot. It is recommended that you sleep in the boot until told otherwise  CALL THE OFFICE WITH ANY QUESTIONS OR CONCERTS: [phone]     Discharge Pin Site Instructions  Dress pins daily with Kerlix roll starting on POD 2. Wrap the Kerlix so that it tamps the skin down around the pin-skin interface to prevent/limit motion of the skin relative to the pin.  (Pin-skin motion is the primary cause of pain and infection related to external fixator pin sites).  Remove any crust or coagulum that may obstruct drainage with a saline moistened gauze or soap and water.  After POD 3, if there is no discernable drainage on the pin site dressing, the interval for change can by increased to every other day.  You may shower with the fixator, cleaning all pin sites gently with soap and water.  If you have a surgical wound this needs to be completely dry and  without drainage before showering.  The extremity can be lifted by the fixator to facilitate wound care and transfers.  Notify the office/Doctor if you experience increasing drainage, redness, or pain from a pin site, or if you notice purulent (thick, snot-like) drainage.  Discharge Wound Care Instructions  Do NOT apply any ointments, solutions or lotions to pin sites or surgical wounds.  These prevent needed drainage and even though solutions like hydrogen peroxide kill bacteria, they also damage cells lining the pin sites that help fight infection.  Applying lotions or ointments can keep the wounds moist and can cause them to breakdown and open up as well. This can increase the risk for infection. When in doubt call the office.  Surgical incisions should be dressed daily.  If any drainage is noted, use one layer of adaptic, then gauze, Kerlix, and an  ace wrap.  Once the incision is completely dry and without drainage, it may be left open to air out.  Showering may begin 36-48 hours later.  Cleaning gently with soap and water.  Traumatic wounds should be dressed daily as well.    One layer of adaptic, gauze, Kerlix, then ace wrap.  The adaptic can be discontinued once the draining has ceased    If you have a wet to dry dressing: wet the gauze with saline the squeeze as much saline out so the gauze is moist (not soaking wet), place moistened gauze over wound, then place a dry gauze over the moist one, followed by Kerlix wrap, then ace wrap.   Cast or Splint Care Casts and splints support injured limbs and keep bones from moving while they heal. It is important to care for your cast or splint at home.  HOME CARE INSTRUCTIONS  Keep the cast or splint uncovered during the drying period. It can take 24 to 48 hours to dry if it is made of plaster. A fiberglass cast will dry in less than 1 hour.  Do not rest the cast on anything harder than a pillow for the first 24 hours.  Do not put weight  on your injured limb or apply pressure to the cast until your health care provider gives you permission.  Keep the cast or splint dry. Wet casts or splints can lose their shape and may not support the limb as well. A wet cast that has lost its shape can also create harmful pressure on your skin when it dries. Also, wet skin can become infected.  Cover the cast or splint with a plastic bag when bathing or when out in the rain or snow. If the cast is on the trunk of the body, take sponge baths until the cast is removed.  If your cast does become wet, dry it with a towel or a blow dryer on the cool setting only.  Keep your cast or splint clean. Soiled casts may be wiped with a moistened cloth.  Do not place any hard or soft foreign objects under your cast or splint, such as cotton, toilet paper, lotion, or powder.  Do not try to scratch the skin under the cast with any object. The object could get stuck inside the cast. Also, scratching could lead to an infection. If itching is a problem, use a blow dryer on a cool setting to relieve discomfort.  Do not trim or cut your cast or remove padding from inside of it.  Exercise all joints next to the injury that are not immobilized by the cast or splint. For example, if you have a long leg cast, exercise the hip joint and toes. If you have an arm cast or splint, exercise the shoulder, elbow, thumb, and fingers.  Elevate your injured arm or leg on 1 or 2 pillows for the first 1 to 3 days to decrease swelling and pain.It is best if you can comfortably elevate your cast so it is higher than your heart. SEEK MEDICAL CARE IF:   Your cast or splint cracks.  Your cast or splint is too tight or too loose.  You have unbearable itching inside the cast.  Your cast becomes wet or develops a soft spot or area.  You have a bad smell coming from inside your cast.  You get an object stuck under your cast.  Your skin around the cast becomes red or raw.  You  have new  pain or worsening pain after the cast has been applied. SEEK IMMEDIATE MEDICAL CARE IF:   You have fluid leaking through the cast.  You are unable to move your fingers or toes.  You have discolored (blue or white), cool, painful, or very swollen fingers or toes beyond the cast.  You have tingling or numbness around the injured area.  You have severe pain or pressure under the cast.  You have any difficulty with your breathing or have shortness of breath.  You have chest pain. Document Released: 05/18/2000 Document Revised: 03/11/2013 Document Reviewed: 11/27/2012 [facility] Patient Information 2015 [employer] [place]. This information is not intended to replace advice given to you by your health care provider. Make sure you discuss any questions you have with your health care provider.

## 2014-11-05 NOTE — Progress Notes (Signed)
Occupational Therapy Treatment Patient Details Name: [name] [name] MRN: [id number] DOB: 1956-04-16 Today's Date: 11/05/2014    History of present illness [name] [name] was on an extension ladder 15-20 feet high cutting a limb when it came down and struck the ladder. He jumped from the ladder and landed first on his feet and then fell backwards bracing himself with his arms. s/p left ORIF distal radius fx, moderate L1 anterior wedge compression fracture, Right tibia plateau/fibula s/p ORIF -- per Dr. Carola FrostHandy, NWB                   OT comments  Patient progressing nicely towards OT goals, continue plan of care for now. Pt continues to require verbal cues for back precaution adherence and back brace wearing orders.    Follow Up Recommendations  No OT follow up;Supervision - Intermittent    Equipment Recommendations  3 in 1 bedside comode;Wheelchair (measurements OT);Wheelchair cushion (measurements OT);Other (comment) (elevating leg rests & L PFRW)    Recommendations for Other Services  None at this time  Precautions / Restrictions Precautions Precautions: Back Precaution Comments: Reiterated back precautions, handout given  Required Braces or Orthoses: Spinal Brace Spinal Brace: Applied in sitting position;Thoracolumbosacral orthotic Restrictions Weight Bearing Restrictions: Yes LUE Weight Bearing: Weight bear through elbow only RLE Weight Bearing: Non weight bearing       Mobility Bed Mobility General bed mobility comments: Patient found seated in recliner upon OT entering/exiting room  Transfers General transfer comment: No transfer completed with OT, pt just got into recliner after PT session.         ADL Overall ADL's : Needs assistance/impaired General ADL Comments: New plan is for pt to d/c > Uncles house. Administerred back precautions handout and went overall all precautions throughoutly from functional standpoint. Also reiterated back brace wearing orders and NWB>RLE and  only WB through L elbow. Assisted pt > therapy gym for education on tub/shower transfers. Educated pt on IF his Kateri McUncle has a tub transfer bench it is OK for him to get in/out of shower, if not recommending sponge baths at this time due to WB restricitions.      Cognition Behavior During Therapy: WFL for tasks assessed/performed Overall Cognitive Status: Within Functional Limits for tasks assessed Memory: Decreased recall of precautions                 Pertinent Vitals/ Pain       Pain Assessment: No/denies pain         Frequency Min 2X/week     Progress Toward Goals  OT Goals(current goals can now be found in the care plan section)  Progress towards OT goals: Progressing toward goals     Plan Discharge plan remains appropriate       End of Session Equipment Utilized During Treatment: Back brace   Activity Tolerance Patient tolerated treatment well   Patient Left in chair;with call bell/phone within reach     Time: 1044-1100 OT Time Calculation (min): 16 min  Charges: OT General Charges $OT Visit: 1 Procedure OT Treatments $Self Care/Home Management : 8-22 mins  Jermane Brayboy , MS, OTR/L, CLT Pager: [phone]  11/05/2014, 11:08 AM

## 2014-11-08 LAB — TESTOSTERONE, % FREE

## 2020-03-14 ENCOUNTER — Emergency Department (HOSPITAL_COMMUNITY)
Admission: EM | Admit: 2020-03-14 | Discharge: 2020-03-14 | Disposition: A | Discharge status: Home / Self Care | Payer: Self-pay | Attending: Emergency Medicine | Admitting: Emergency Medicine

## 2020-03-14 ENCOUNTER — Emergency Department (HOSPITAL_COMMUNITY): Payer: Self-pay

## 2020-03-14 ENCOUNTER — Other Ambulatory Visit: Payer: Self-pay

## 2020-03-14 DIAGNOSIS — N4889 Other specified disorders of penis: Secondary | ICD-10-CM | POA: Insufficient documentation

## 2020-03-14 DIAGNOSIS — S3994XA Unspecified injury of external genitals, initial encounter: Secondary | ICD-10-CM

## 2020-03-14 DIAGNOSIS — Z87891 Personal history of nicotine dependence: Secondary | ICD-10-CM | POA: Insufficient documentation

## 2020-03-14 LAB — CBC WITH DIFFERENTIAL/PLATELET
Abs Immature Granulocytes: 0.05 10*3/uL (ref 0.00–0.07)
Basophils Absolute: 0.1 10*3/uL (ref 0.0–0.1)
Basophils Relative: 1 %
Eosinophils Absolute: 0.4 10*3/uL (ref 0.0–0.5)
Eosinophils Relative: 3 %
HCT: 47.5 % (ref 39.0–52.0)
Hemoglobin: 15.9 g/dL (ref 13.0–17.0)
Immature Granulocytes: 0 %
Lymphocytes Relative: 15 %
Lymphs Abs: 2 10*3/uL (ref 0.7–4.0)
MCH: 31.3 pg (ref 26.0–34.0)
MCHC: 33.5 g/dL (ref 30.0–36.0)
MCV: 93.5 fL (ref 80.0–100.0)
Monocytes Absolute: 1.3 10*3/uL — ABNORMAL HIGH (ref 0.1–1.0)
Monocytes Relative: 10 %
Neutro Abs: 9.3 10*3/uL — ABNORMAL HIGH (ref 1.7–7.7)
Neutrophils Relative %: 71 %
Platelets: 330 10*3/uL (ref 150–400)
RBC: 5.08 MIL/uL (ref 4.22–5.81)
RDW: 12.5 % (ref 11.5–15.5)
WBC: 13 10*3/uL — ABNORMAL HIGH (ref 4.0–10.5)
nRBC: 0 % (ref 0.0–0.2)

## 2020-03-14 LAB — URINALYSIS, ROUTINE W REFLEX MICROSCOPIC
Bilirubin Urine: NEGATIVE
Glucose, UA: NEGATIVE mg/dL
Hgb urine dipstick: NEGATIVE
Ketones, ur: 5 mg/dL — AB
Leukocytes,Ua: NEGATIVE
Nitrite: NEGATIVE
Protein, ur: NEGATIVE mg/dL
Specific Gravity, Urine: 1.01 (ref 1.005–1.030)
pH: 5 (ref 5.0–8.0)

## 2020-03-14 LAB — BASIC METABOLIC PANEL
Anion gap: 13 (ref 5–15)
BUN: 14 mg/dL (ref 8–23)
CO2: 26 mmol/L (ref 22–32)
Calcium: 9.7 mg/dL (ref 8.9–10.3)
Chloride: 100 mmol/L (ref 98–111)
Creatinine, Ser: 1.04 mg/dL (ref 0.61–1.24)
GFR, Estimated: >60 mL/min (ref >60)
Glucose, Bld: 99 mg/dL (ref 70–99)
Potassium: 3.3 mmol/L — ABNORMAL LOW (ref 3.5–5.1)
Sodium: 139 mmol/L (ref 135–145)

## 2020-03-14 MED ORDER — HYDROMORPHONE HCL 1 MG/ML IJ SOLN
INTRAMUSCULAR | Status: AC
  Filled 2020-03-14: qty 1

## 2020-03-14 NOTE — Consult Note (Signed)
Urology Consult   Physician requesting consult: Marily Memos  Reason for consult: Penile strangulation from Gatorade bottle  History of Present Illness: [name] [name] is a 64 y.o. who presents to the ED this a.m. after he awoke on 03/13/2020 at 4 PM with a Gatorade bottle stuck on the end of his penis.  Urology was consulted assumes he arrived to the ED.  Just prior to evaluating the patient, the ED physician managed to remove the plastic bottle with diamond cutters provided by maintenance.  The patient's attempts to remove this at home were futile.  He states he tried to [employer].  Patient states that he did not place the object on his penis but rather his girlfriend must of put it on his penis while he was sleeping.  He states when he woke up, his penis was edematous and in pain.  Patient denies being able to void since 4 PM yesterday.  He denies a prior history of lower urinary tract symptoms.  Past Medical History:  Diagnosis Date  . Accidental fall from ladder 10/26/2014   "jumped to prevent chainsaw injury"    Past Surgical History:  Procedure Laterality Date  . EXTERNAL FIXATION LEG Right 10/28/2014   Procedure: EXTERNAL FIXATION LEG;  Surgeon: Myrene Galas, MD;  Location: [facility] OR;  Service: Orthopedics;  Laterality: Right;  . FASCIOTOMY Right 10/28/2014   Procedure: FASCIOTOMY;  Surgeon: Myrene Galas, MD;  Location: [facility] OR;  Service: Orthopedics;  Laterality: Right;  . NO PAST SURGERIES    . OPEN REDUCTION INTERNAL FIXATION (ORIF) DISTAL RADIAL FRACTURE Left 10/28/2014   Procedure: OPEN REDUCTION INTERNAL FIXATION (ORIF) DISTAL RADIAL FRACTURE;  Surgeon: Myrene Galas, MD;  Location: [facility] OR;  Service: Orthopedics;  Laterality: Left;  . ORIF TIBIA PLATEAU Right 11/02/2014   Procedure: OPEN REDUCTION INTERNAL FIXATION (ORIF) TIBIAL PLATEAU , REMOVAL OF EX FIX ;  Surgeon: Myrene Galas, MD;  Location: [facility] OR;  Service: Orthopedics;  Laterality: Right;    Medications:  Home meds:  No  current facility-administered medications on file prior to encounter.   Current Outpatient Medications on File Prior to Encounter  Medication Sig Dispense Refill  . cholecalciferol 1000 UNITS tablet Take 1 tablet (1,000 Units total) by mouth 2 (two) times daily. 60 tablet 5  . enoxaparin (LOVENOX) 40 MG/0.4ML injection Inject 0.4 mLs (40 mg total) into the skin daily. 10 Syringe 0  . methocarbamol (ROBAXIN) 500 MG tablet Take 2 tablets (1,000 mg total) by mouth every 6 (six) hours as needed for muscle spasms. 50 tablet 0  . oxyCODONE-acetaminophen (PERCOCET) 7.5-325 MG per tablet Take 1-2 tablets by mouth every 4 (four) hours as needed. 168 tablet 0  . vitamin C (VITAMIN C) 500 MG tablet Take 1 tablet (500 mg total) by mouth 2 (two) times daily. 60 tablet 1  . Vitamin D, Ergocalciferol, (DRISDOL) 50000 UNITS CAPS capsule Take 1 capsule (50,000 Units total) by mouth every 7 (seven) days. 12 capsule 0     Scheduled Meds: Continuous Infusions: PRN Meds:.  Allergies: No Known Allergies  No family history on file.  Social History:  reports that he has quit smoking. His smoking use included cigarettes. He has a 5.00 pack-year smoking history. He has never used smokeless tobacco. He reports current alcohol use. He reports current drug use. Drug: Marijuana.  ROS: A complete review of systems was performed.  All systems are negative except for pertinent findings as noted.  Physical Exam:  Vital signs in last 24 hours: Temp:  [  96.3 F (35.7 C)] 96.3 F (35.7 C) (10/11 0445) Pulse Rate:  [61] 61 (10/11 0445) Resp:  [16] 16 (10/11 0445) BP: (128)/(80) 128/80 (10/11 0445) SpO2:  [99 %] 99 % (10/11 0445) Weight:  [71.9 kg] 71.9 kg (10/11 0259) Constitutional:  Alert and oriented, No acute distress Cardiovascular: Regular rate and rhythm Respiratory: Normal respiratory effort, Lungs clear bilaterally GI: Abdomen is soft, nontender, nondistended, no abdominal masses Genitourinary: No CVAT.   Edematous penis.  Glans sensate.  Evidence of ring impression at the base of the penis.  Penis is healthy with good capillary refill. Testes are descended bilaterally and non-tender and without masses, scrotum is normal in appearance without lesions or masses, perineum is normal on inspection. Neurologic: Grossly intact, no focal deficits Psychiatric: Normal mood and affect  Laboratory Data:  No results for input(s): WBC, HGB, HCT, PLT in the last 72 hours.  No results for input(s): NA, K, CL, GLUCOSE, BUN, CALCIUM, CREATININE in the last 72 hours.  Invalid input(s): CO3   No results found for this or any previous visit (from the past 24 hour(s)). No results found for this or any previous visit (from the past 240 hour(s)).  Renal Function: No results for input(s): CREATININE in the last 168 hours. CrCl cannot be calculated (Patient's most recent lab result is older than the maximum 21 days allowed.).  Radiologic Imaging: No results found.  I independently reviewed the above imaging studies.  Impression/Recommendation 1. Penile strangulation: From plastic Gatorade bottle of this place sometime prior to patient awakening around 1600 on 03/13/2020.  -Plastic polyp successfully removed by ED attending using the aid of a diamond cutter just prior to urology evaluation. -Penis is edematous however with good capillary refill and sensate. -Patient must demonstrate he can void prior to discharge.  Record PVR after voiding.  If unable to void, he may need a Foley versus suprapubic tube. -He has been advised that he should not place objects on his penis.  Jannifer Hick 03/14/2020, 5:10 AM  Matt R. Keziyah Kneale MD [facility]  Pager: [phone]   CC: Marily Memos

## 2020-03-14 NOTE — Discharge Instructions (Signed)
Your imaging today showed soft tissue swelling and injury at the site of your injury today.  Fortunately, you were able to urinate and there was no evidence of infection.  Please follow-up with urology and he may use over-the-counter medication to help with the inflammation and pain.  Please rest and stay hydrated.  If any symptoms change or worsen or you get or you cannot urinate again, please return to the nearest emergency department as you may need a catheter at that time.

## 2020-03-14 NOTE — ED Provider Notes (Signed)
[facility] EMERGENCY DEPARTMENT Provider Note   CSN: [id number] Arrival date & time: 03/14/20  0255     History Chief Complaint  Patient presents with  . swelling penis    [name] [name] is a 64 y.o. male.  Patient states that he was at home and woke up yesterday on the 10th from 1600 from a nap with a Gatorade bottle on his penis.  He thinks his girlfriend brother because she thinks he was cheating on her.  Patient attempted to get it off multiple times throughout the day yesterday but using a hack saw multiple other attempts ultimately having significant swelling and edema to his penis.  Presents here for management.  Has not urinated since that time.        Past Medical History:  Diagnosis Date  . Accidental fall from ladder 10/26/2014   "jumped to prevent chainsaw injury"    Patient Active Problem List   Diagnosis Date Noted  . Acute urinary retention 11/03/2014  . Acute blood loss anemia 10/29/2014  . Fracture of right tibial plateau 10/27/2014  . Fracture of head of right fibula 10/27/2014  . Compression fracture of L1 lumbar vertebra (HCC) 10/27/2014  . Closed fracture of left radius 10/27/2014  . Dislocation of left ulnar styloid 10/27/2014  . Fall from ladder 10/27/2014    Past Surgical History:  Procedure Laterality Date  . EXTERNAL FIXATION LEG Right 10/28/2014   Procedure: EXTERNAL FIXATION LEG;  Surgeon: Myrene Galas, MD;  Location: [facility] OR;  Service: Orthopedics;  Laterality: Right;  . FASCIOTOMY Right 10/28/2014   Procedure: FASCIOTOMY;  Surgeon: Myrene Galas, MD;  Location: [facility] OR;  Service: Orthopedics;  Laterality: Right;  . NO PAST SURGERIES    . OPEN REDUCTION INTERNAL FIXATION (ORIF) DISTAL RADIAL FRACTURE Left 10/28/2014   Procedure: OPEN REDUCTION INTERNAL FIXATION (ORIF) DISTAL RADIAL FRACTURE;  Surgeon: Myrene Galas, MD;  Location: [facility] OR;  Service: Orthopedics;  Laterality: Left;  . ORIF TIBIA PLATEAU Right 11/02/2014    Procedure: OPEN REDUCTION INTERNAL FIXATION (ORIF) TIBIAL PLATEAU , REMOVAL OF EX FIX ;  Surgeon: Myrene Galas, MD;  Location: [facility] OR;  Service: Orthopedics;  Laterality: Right;       No family history on file.  Social History   Tobacco Use  . Smoking status: Former Smoker    Packs/day: 1.00    Years: 5.00    Pack years: 5.00    Types: Cigarettes  . Smokeless tobacco: Never Used  . Tobacco comment: "quit smoking in ~ 1981"  Substance Use Topics  . Alcohol use: Yes    Comment: "quit drinking in 1999"  . Drug use: Yes    Types: Marijuana    Comment: "quit smoking weed in ~ 1995"    Home Medications Prior to Admission medications   Medication Sig Start Date End Date Taking? Authorizing Provider  cholecalciferol 1000 UNITS tablet Take 1 tablet (1,000 Units total) by mouth 2 (two) times daily. 11/05/14   Montez Morita, PA-C  enoxaparin (LOVENOX) 40 MG/0.4ML injection Inject 0.4 mLs (40 mg total) into the skin daily. 11/05/14   Montez Morita, PA-C  methocarbamol (ROBAXIN) 500 MG tablet Take 2 tablets (1,000 mg total) by mouth every 6 (six) hours as needed for muscle spasms. 11/05/14   Freeman Caldron, PA-C  oxyCODONE-acetaminophen (PERCOCET) 7.5-325 MG per tablet Take 1-2 tablets by mouth every 4 (four) hours as needed. 11/05/14   Freeman Caldron, PA-C  vitamin C (VITAMIN C) 500 MG tablet  Take 1 tablet (500 mg total) by mouth 2 (two) times daily. 11/05/14   Montez Morita, PA-C  Vitamin D, Ergocalciferol, (DRISDOL) 50000 UNITS CAPS capsule Take 1 capsule (50,000 Units total) by mouth every 7 (seven) days. 11/05/14   Montez Morita, PA-C    Allergies    Patient has no known allergies.  Review of Systems   Review of Systems  All other systems reviewed and are negative.   Physical Exam Updated Vital Signs BP 128/80   Pulse 61   Temp (!) 96.3 F (35.7 C) (Temporal)   Resp 16   Ht 5\' 10"  (1.778 m)   Wt 71.9 kg   SpO2 99%   BMI 22.74 kg/m   Physical Exam Vitals and nursing note  reviewed.  Constitutional:      Appearance: He is well-developed.  HENT:     Head: Normocephalic and atraumatic.     Nose: Nose normal.     Mouth/Throat:     Mouth: Mucous membranes are moist.  Eyes:     Pupils: Pupils are equal, round, and reactive to light.  Cardiovascular:     Rate and Rhythm: Normal rate.  Pulmonary:     Effort: Pulmonary effort is normal. No respiratory distress.  Abdominal:     General: There is no distension.  Genitourinary:    Comments: Please see picture below for depiction of penis during strangulation.  Penis distal to restriction was tense Musculoskeletal:        General: No swelling or tenderness. Normal range of motion.     Cervical back: Normal range of motion.  Skin:    General: Skin is warm and dry.     Coloration: Skin is not jaundiced or pale.  Neurological:     General: No focal deficit present.     Mental Status: He is alert.       ED Results / Procedures / Treatments   Labs (all labs ordered are listed, but only abnormal results are displayed) Labs Reviewed  URINALYSIS, ROUTINE W REFLEX MICROSCOPIC  CBC WITH DIFFERENTIAL/PLATELET  BASIC METABOLIC PANEL    EKG None  Radiology No results found.  Procedures Procedures (including critical care time)  Medications Ordered in ED Medications  HYDROmorphone (DILAUDID) 1 MG/ML injection (  Given 03/14/20 0429)    ED Course  I have reviewed the triage vital signs and the nursing notes.  Pertinent labs & imaging results that were available during my care of the patient were reviewed by me and considered in my medical decision making (see chart for details).    MDM Rules/Calculators/A&P                         Urologist consulted immediately to my initial examination.  Worked for approximately 40 minutes freeing his penis from the bottle.  Ultimately was able to free up and then sensation returned and good swelling improvement.  Still not urinating after over an hour of proved  edema.  Bladder scan showed 176.  Consider possible ruptured bladder or other anatomic abnormality.  Patient has been drinking quite a bit of fluid and still is not urinating.  We will get a CT scan, CBC and BMP.  Care transferred pending finishing workup and final disposition.   Final Clinical Impression(s) / ED Diagnoses Final diagnoses:  None    Rx / DC Orders ED Discharge Orders    None       Madilynn Montante, 05/14/20, MD 03/15/20 0021

## 2020-03-14 NOTE — ED Triage Notes (Signed)
The pt arrived by gems from home.he went to bed yesterday am with his girlfriend and he woke up with a plastic bottle on his penis since 1600 he has attempted to cut the bottle off but has been unable to do so.  Everything he has tried has not worked.  He reports swelling  And pain

## 2020-03-14 NOTE — ED Notes (Signed)
The last time he voided was at 1400

## 2020-03-14 NOTE — ED Notes (Signed)
176 on bladder scan, gave him more water

## 2020-03-14 NOTE — ED Notes (Signed)
Pt given water, attempting to urinate

## 2020-03-14 NOTE — ED Provider Notes (Signed)
7:31 AM Care assumed from Dr. Clayborne Dana.  At time of transfer care, patient awaiting for results of CT scan and urinalysis and reassessment.  According to previous team, if patient is unable to urinate, will likely need to contact urology again for suprapubic catheter placement.  Still awaiting to urinate.    CT scan returned showing marked edema in the soft tissues of the penis and scrotum with slightly higher attenuation of the bladder either related to concentrated urine/dehydration or infection.  Otherwise a nonacute L1 compression fracture seen but no other significant injuries were seen.  Anticipate reassessment.   9:14 AM Patient was just able to urinate.  We will get the urinalysis and culture sent.  Now that he was able to urinate, do not feel he needs a suprapubic catheter or Foley catheter.  Anticipate discharge after urinalysis completed.  9:34 AM Urinalysis returned without any evidence of infection.  A culture was sent however.  Patient was reassessed and he says he is feeling better.  We will discharge home with plans to follow-up with urology and good return precautions.  He understood plan of care and was discharged in good condition.   Clinical Impression: 1. Injury to penis, initial encounter   2. Swelling of penis     Disposition: Discharge  Condition: Good  I have discussed the results, Dx and Tx plan with the pt(& family if present). He/she/they expressed understanding and agree(s) with the plan. Discharge instructions discussed at great length. Strict return precautions discussed and pt &/or family have verbalized understanding of the instructions. No further questions at time of discharge.    New Prescriptions   No medications on file    Follow Up: [facility] [address] [place] [address] [phone]       Toriann Spadoni, Canary Brim, MD 03/14/20 [phone]

## 2020-03-15 LAB — URINE CULTURE: Culture: <10000 — AB

## 2022-07-09 IMAGING — CT CT RENAL STONE PROTOCOL
2 of 4 series · 15 of 46 positions shown, 17 images · non-contrast
Comparison: None.

CLINICAL DATA: Right flank pain. Kidney stones suspected.Penile
strangulation from Gatorade bottle

EXAM:
CT ABDOMEN AND PELVIS WITHOUT CONTRAST
TECHNIQUE: Multidetector CT imaging of the abdomen and pelvis was performed
following the standard protocol without IV contrast.

[Series 3: renal stone 5.0 · axial · 0.86mm/px · z∈[+753,+1288]mm · 12 of 122 slices shown, 14 images]
[im 10/122  soft-tissue]
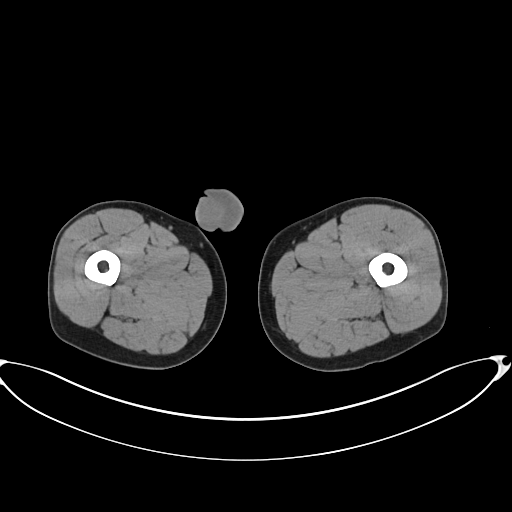
[im 10/122  bone]
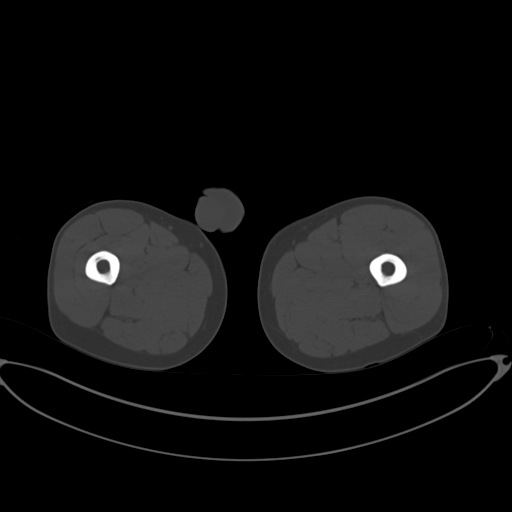
[im 20/122  soft-tissue]
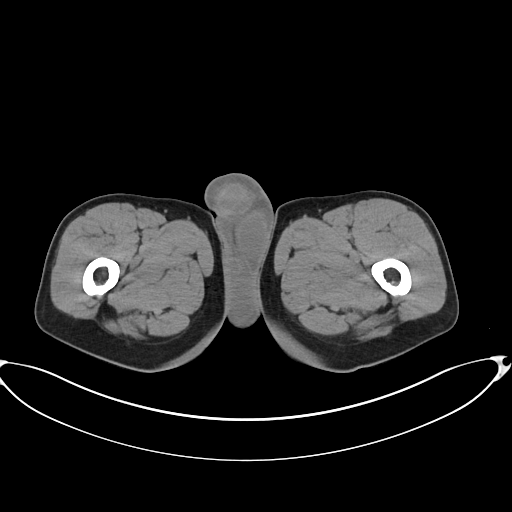
[im 30/122  soft-tissue]
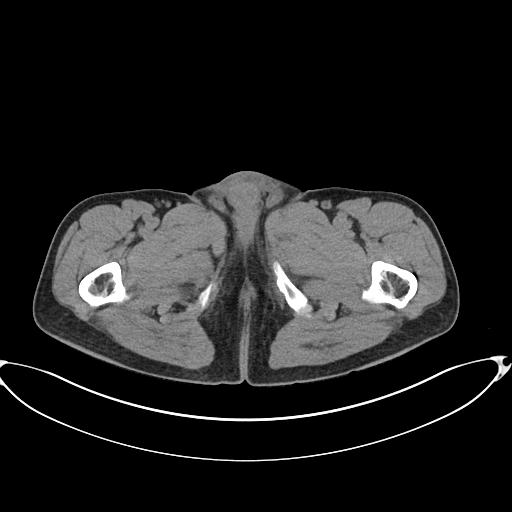
[im 39/122  soft-tissue]
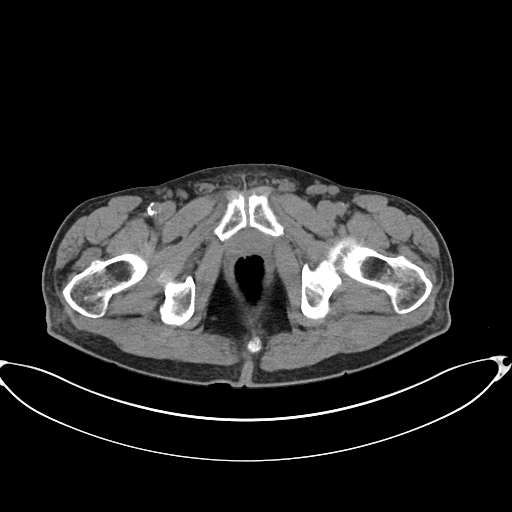
[im 49/122  soft-tissue]
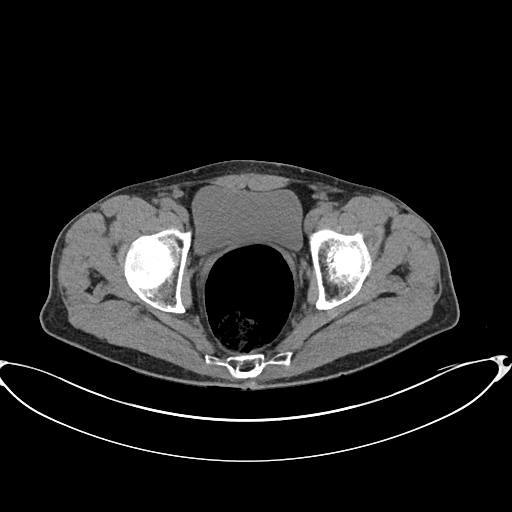
[im 59/122  soft-tissue]
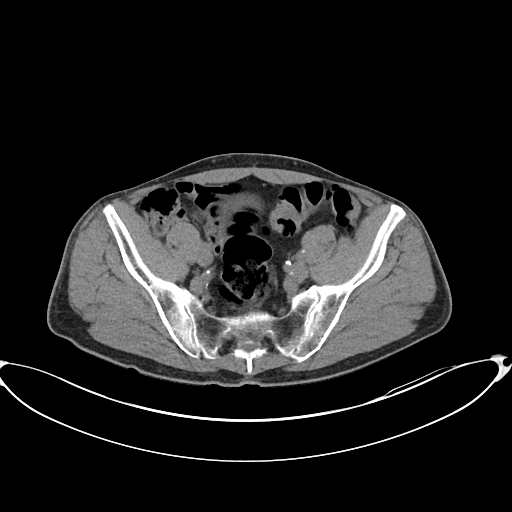
[im 68/122  soft-tissue]
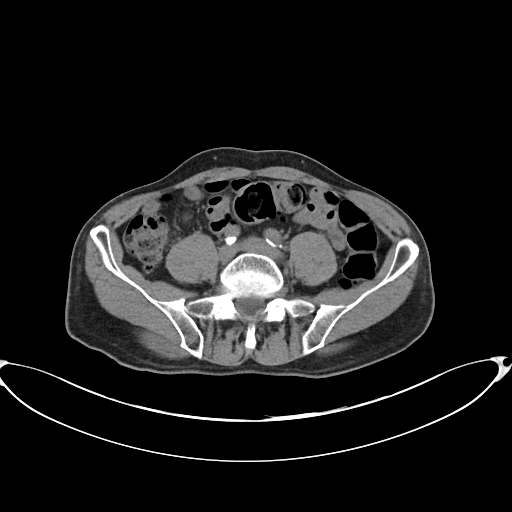
[im 78/122  soft-tissue]
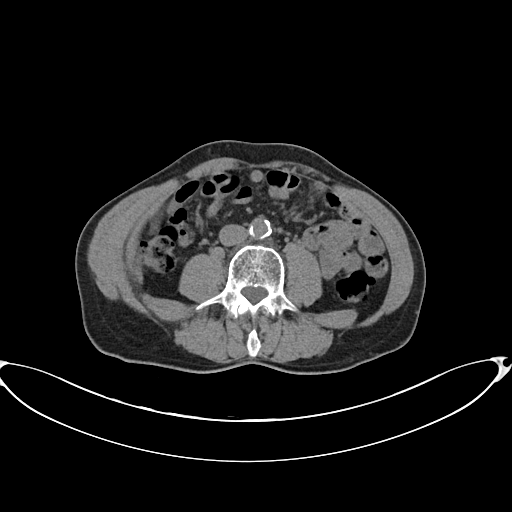
[im 88/122  soft-tissue]
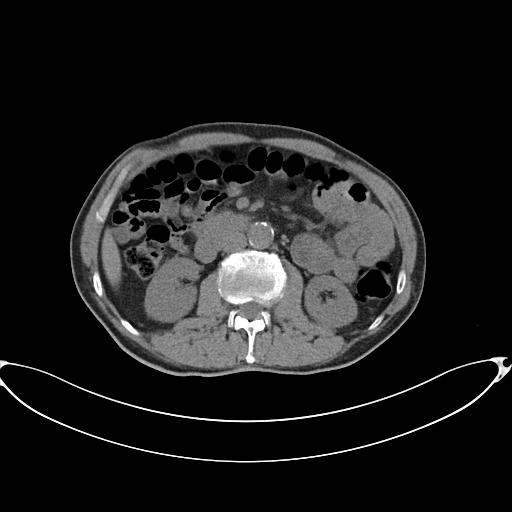
[im 88/122  bone]
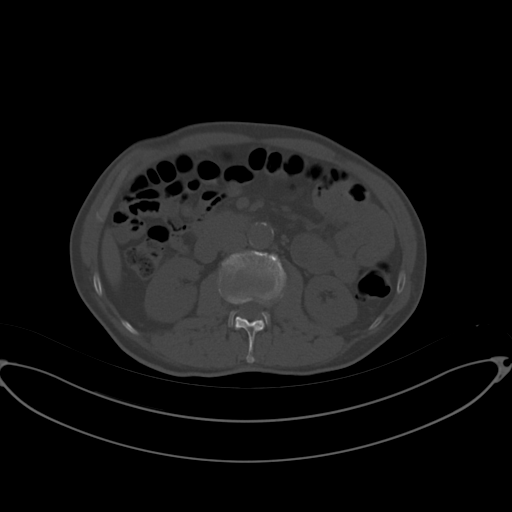
[im 97/122  soft-tissue]
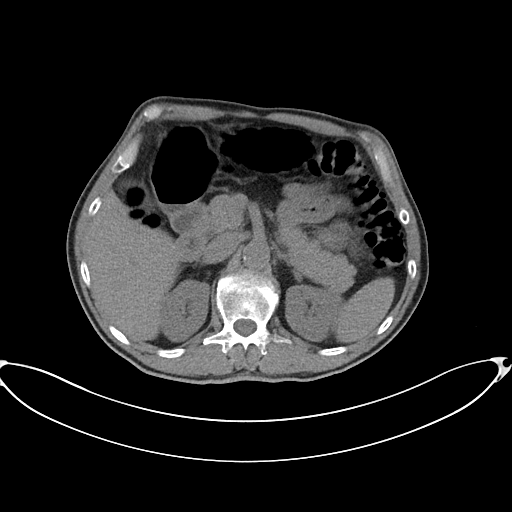
[im 107/122  soft-tissue]
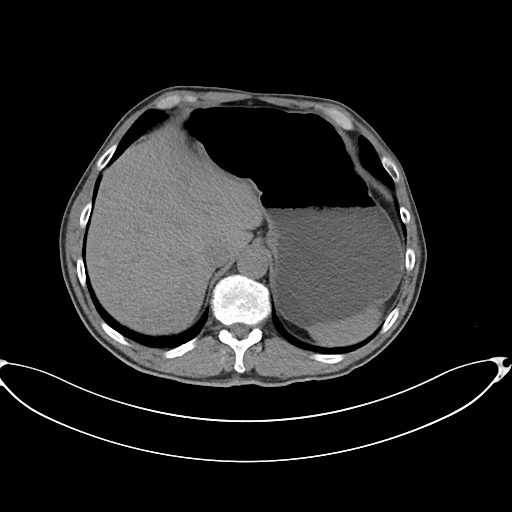
[im 117/122  soft-tissue]
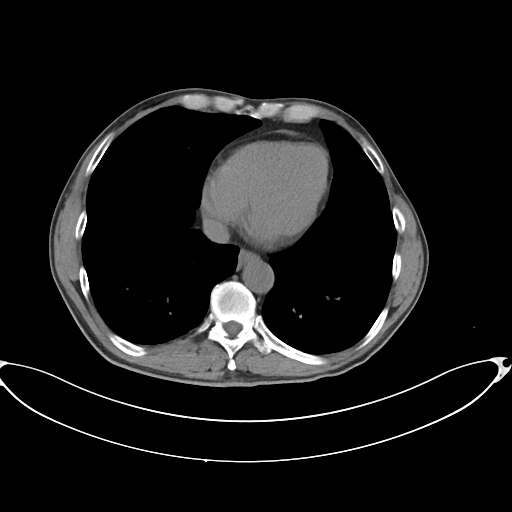

[Series 6: coronal · coronal · 0.83mm/px · 3 of 107 slices shown]
[im 36/107  soft-tissue]
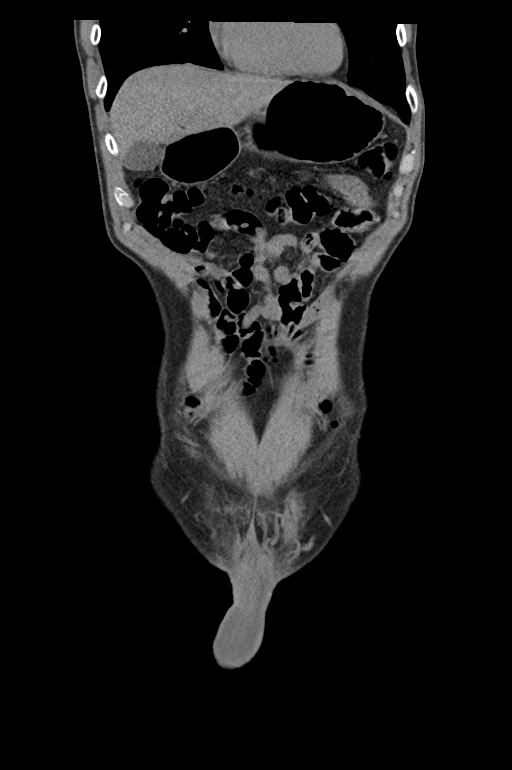
[im 48/107  soft-tissue]
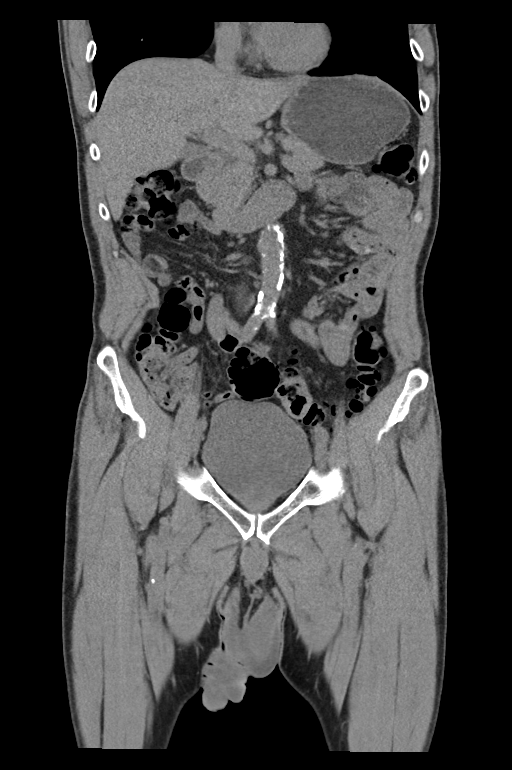
[im 59/107  soft-tissue]
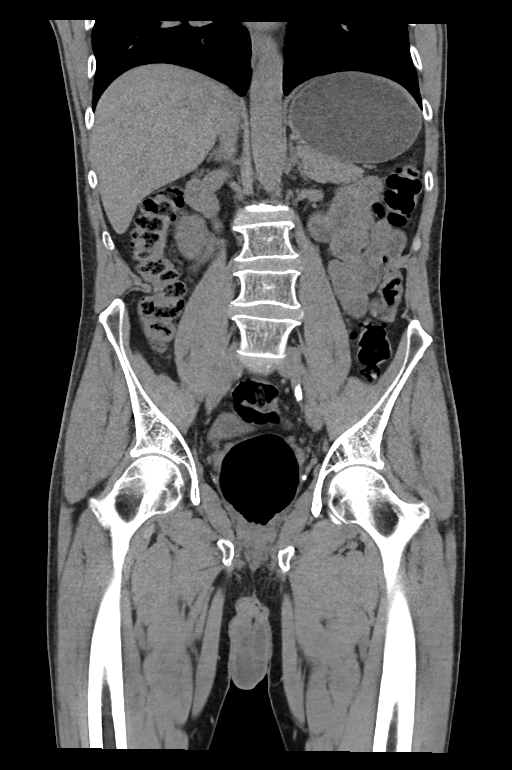

[15 of 46 positions shown; findings below may reference images not displayed]

FINDINGS: Lower chest: Unremarkable.

Hepatobiliary: Several tiny hypodensities in the liver parenchyma
are too small to characterize but likely benign. There is no
evidence for gallstones, gallbladder wall thickening, or
pericholecystic fluid. No intrahepatic or extrahepatic biliary
dilation.

Pancreas: No focal mass lesion. No dilatation of the main duct. No
intraparenchymal cyst. No peripancreatic edema.

Spleen: No splenomegaly. No focal mass lesion.

Adrenals/Urinary Tract: No adrenal nodule or mass. Kidneys
unremarkable. No evidence for hydroureter. Urine in the bladder has
attenuation slightly higher than would be expected and may be
related to concentrated state or bladder infection. Hematuria would
also be a consideration.

Stomach/Bowel: Stomach is distended with gas and fluid. Duodenum is
normally positioned as is the ligament of Treitz. No small bowel
wall thickening. No small bowel dilatation. The terminal ileum is
normal. The appendix is normal. No gross colonic mass. No colonic
wall thickening. Diverticular changes are noted in the left colon
without evidence of diverticulitis.

Vascular/Lymphatic: There is abdominal aortic atherosclerosis
without aneurysm. There is no gastrohepatic or hepatoduodenal
ligament lymphadenopathy. No retroperitoneal or mesenteric
lymphadenopathy. No pelvic sidewall lymphadenopathy.

Reproductive: The prostate gland and seminal vesicles are
unremarkable. Marked edema noted in the soft tissues of the penis
and scrotum with subcutaneous edema extending up into the low
suprapubic region.

Other: No intraperitoneal free fluid.

Musculoskeletal: No worrisome lytic or sclerotic osseous
abnormality. L1 compression fracture again noted with no substantial
change since 2722 lumbar spine CT
IMPRESSION: 1. Marked edema in the soft tissues of the penis and scrotum with
subcutaneous edema extending up into the low suprapubic region.
2. Urine in the bladder has attenuation slightly higher than
expected and may be related to concentrated urine/dehydration or
bladder infection. Hematuria would also be a consideration.
3. Left colonic diverticulosis without diverticulitis.
4. L1 compression fracture, nonacute.
5. Aortic Atherosclerosis ([id number]).
